# Patient Record
Sex: Female | Born: 1970
Health system: Southern US, Community
[De-identification: ages and names within clinical notes are randomized; demographics above are authoritative.]

## PROBLEM LIST (undated history)

## (undated) DIAGNOSIS — K8020 Calculus of gallbladder without cholecystitis without obstruction: Secondary | ICD-10-CM

## (undated) DIAGNOSIS — E079 Disorder of thyroid, unspecified: Secondary | ICD-10-CM

## (undated) HISTORY — PX: OTHER SURGICAL HISTORY: SHX169

## (undated) HISTORY — DX: Disorder of thyroid, unspecified: E07.9

## (undated) HISTORY — DX: Calculus of gallbladder without cholecystitis without obstruction: K80.20

---

## 2010-12-23 ENCOUNTER — Encounter
Admission: RE | Admit: 2010-12-23 | Discharge: 2010-12-23 | Payer: Self-pay | Source: Home / Self Care | Attending: Infectious Diseases | Admitting: Infectious Diseases

## 2011-05-24 ENCOUNTER — Inpatient Hospital Stay (HOSPITAL_COMMUNITY): Admit: 2011-05-24 | Payer: Medicaid Other | Admitting: Obstetrics

## 2011-05-28 ENCOUNTER — Encounter (HOSPITAL_COMMUNITY)
Admission: RE | Admit: 2011-05-28 | Discharge: 2011-05-28 | Disposition: A | Discharge status: Home / Self Care | Payer: Medicaid Other | Source: Ambulatory Visit | Attending: Obstetrics | Admitting: Obstetrics

## 2011-05-28 LAB — CBC
HCT: 42.9 % (ref 36.0–46.0)
Hemoglobin: 14.8 g/dL (ref 12.0–15.0)
MCH: 32.1 pg (ref 26.0–34.0)
MCHC: 34.5 g/dL (ref 30.0–36.0)
RBC: 4.61 MIL/uL (ref 3.87–5.11)

## 2011-05-28 LAB — SURGICAL PCR SCREEN
MRSA, PCR: NEGATIVE
Staphylococcus aureus: NEGATIVE

## 2011-05-28 NOTE — Patient Instructions (Addendum)
20 Donna Stephens  05/28/2011   Your procedure is scheduled on:  l  Report to [facility] at l AM.  Call this number if you have problems the morning of surgery: [phone]   Remember:   Do not eat food:After Midnight.  Do not drink clear liquids: After Midnight.  Take these medicines the morning of surgery with A SIP OF WATER: l   Do not wear jewelry, make-up or nail polish.  Do not bring valuables to the hospital.  Contacts, dentures or bridgework may not be worn into surgery.  Leave suitcase in the car. After surgery it may be brought to your room.  For patients admitted to the hospital, checkout time is 11:00 AM the day of discharge.   Patients discharged the day of surgery will not be allowed to drive home.  Name and phone number of your driver: l  Special Instructions: Incentive Spirometry - Practice and bring it with you on the day of surgery. and CHG Shower Use entire pack 2 days before surgery and repeat 1 day before surgery.   Please read over the following fact sheets that you were given:  hhhh Document Released: 11/23/2005 Document Re-Released: 03/11/2009 [facility] Patient Information 2011 [place], [place].

## 2011-05-29 ENCOUNTER — Ambulatory Visit (HOSPITAL_COMMUNITY)
Admission: RE | Admit: 2011-05-29 | Discharge: 2011-05-29 | Disposition: A | Discharge status: Home / Self Care | Payer: Medicaid Other | Source: Ambulatory Visit | Attending: Obstetrics | Admitting: Obstetrics

## 2011-05-29 ENCOUNTER — Other Ambulatory Visit: Payer: Self-pay | Admitting: Obstetrics

## 2011-05-29 ENCOUNTER — Inpatient Hospital Stay (HOSPITAL_COMMUNITY)
Admission: RE | Admit: 2011-05-29 | Discharge: 2011-06-01 | DRG: 766 | Disposition: A | Discharge status: Home / Self Care | Payer: Medicaid Other | Source: Ambulatory Visit | Attending: Obstetrics | Admitting: Obstetrics

## 2011-05-29 DIAGNOSIS — Z01812 Encounter for preprocedural laboratory examination: Secondary | ICD-10-CM

## 2011-05-29 DIAGNOSIS — O321XX0 Maternal care for breech presentation, not applicable or unspecified: Principal | ICD-10-CM | POA: Diagnosis present

## 2011-05-29 DIAGNOSIS — Z01818 Encounter for other preprocedural examination: Secondary | ICD-10-CM

## 2011-05-29 DIAGNOSIS — O09529 Supervision of elderly multigravida, unspecified trimester: Secondary | ICD-10-CM | POA: Diagnosis present

## 2011-05-29 LAB — ABO/RH: ABO/RH(D): O POS

## 2011-05-30 LAB — CBC
MCV: 93.2 fL (ref 78.0–100.0)
Platelets: 142 10*3/uL — ABNORMAL LOW (ref 150–400)
RBC: 3.81 MIL/uL — ABNORMAL LOW (ref 3.87–5.11)
RDW: 13.9 % (ref 11.5–15.5)
WBC: 13.2 10*3/uL — ABNORMAL HIGH (ref 4.0–10.5)

## 2011-06-01 NOTE — Op Note (Addendum)
NAMEBHUMI, GODBEY                  ACCOUNT NO.:  [id number]  MEDICAL RECORD NO.:  [id number]  LOCATION:  ULT                           [facility]ILITY:  [facility]  PHYSICIAN:  Shivali Quackenbush A. Clearance Coots, M.D.DATE OF BIRTH:  08-18-1971  DATE OF PROCEDURE:  05/29/2011 DATE OF DISCHARGE:                              OPERATIVE REPORT   PREOPERATIVE DIAGNOSIS:  40 plus weeks' gestation, breech presentation.  POSTOPERATIVE DIAGNOSIS:  40 plus weeks' gestation, breech presentation.  PROCEDURE:  Primary low transverse cesarean section.  SURGEON:  Tiphany Fayson A. Clearance Coots, MD  ASSISTANT:  Roseanna Rainbow, MD  ANESTHESIA:  Spinal.  ESTIMATED BLOOD LOSS:  700 mL.  COMPLICATIONS:  None.  Foley to gravity.  FINDINGS:  Viable female at 0805, Apgars of 8 at 1 minute and 9 at 5 minutes, weight of 7 pounds 15 and ounces, uterine fibroids, small.  SPECIMEN:  Placenta.  DISPOSITION:  Specimen to pathology.  OPERATION:  The patient was brought to the operating room and after satisfactory spinal anesthesia, the abdomen was prepped and draped in a usual sterile fashion.  An indwelling Foley catheter was placed in the urinary bladder.  Pfannenstiel skin incision was made with a scalpel that was deepened down to the fascia with a scalpel.  Fascia was nicked in the midline and the fascial incision was extended to the left and to the right with curved Mayo scissors.  The superior and inferior fascial edges were taken off the rectus muscle with both blunt and sharp dissection.  The rectus muscle was then sharply divided in the midline. Peritoneum was entered digitally and was digitally extended to the left and to the right.  The bladder blade was positioned in the incision. The vesicouterine fold of peritoneum above the reflection of the urinary bladder was grasped with forceps and was incised and undermined with Metzenbaum scissors.  The incision was extended to the left and to the right with Metzenbaum scissors.   The bladder flap was bluntly developed and the bladder blade was repositioned in front of the urinary bladder placing it well out of the operative field.  The uterus was then entered transversely in the lower uterine segment with a scalpel.  The uterine incision was extended to the left and to the right with the bandage scissors.  Clear amniotic fluid was expelled.  The delivery was then accomplished as a footling breech delivery in routine fashion without complications.  The infant's mouth and nose were suctioned with a suction bulb and the umbilical cord was doubly clamped and cut and the infant was handed off to the nursery staff.  Cord blood was obtained and the placenta was manually removed from the uterus intact.  The endometrial surface was thoroughly debrided with a dry lap sponge.  The edges of the uterine incision were grasped with ring forceps.  Uterus was closed with a continuous interlocking suture of 0 Monocryl from each corner to the center.  A second imbricating continuous layer of 0 Monocryl was then placed.  Hemostasis was excellent.  Pelvic cavity was thoroughly irrigated with warm saline solution and all clots were removed.  The abdomen was then closed as follows.  Inverted  T of Interceed antiadhesive agent was placed on the uterine incision and uterine fundus.  The parietal peritoneum was then closed with a continuous suture of 2-0 Vicryl.  The fascia was closed with a continuous suture of 0 Vicryl from each corner to the center. Subcutaneous tissue was thoroughly irrigated with warm saline solution and all areas of subcutaneous bleeding were coagulated with the Bovie. The skin was then closed with surgical stainless steel staples.  Sterile bandage was applied to the incision closure.  Surgical technician indicated that all needle, sponge, and instrument counts were correct x2.  The patient tolerated the procedure well, was transported to the recovery room in  satisfactory condition.     Taffie Eckmann A. Clearance Coots, M.D.     CAH/MEDQ  D:  05/29/2011  T:  05/29/2011  Job:  [id number]  Electronically Signed by Coral Ceo M.D. on 06/09/2011 09:17:19 AM

## 2011-06-05 ENCOUNTER — Inpatient Hospital Stay (HOSPITAL_COMMUNITY)
Admission: AD | Admit: 2011-06-05 | Discharge: 2011-06-05 | Disposition: A | Discharge status: Home / Self Care | Payer: Medicaid Other | Source: Ambulatory Visit | Attending: Obstetrics | Admitting: Obstetrics

## 2011-06-05 DIAGNOSIS — O909 Complication of the puerperium, unspecified: Secondary | ICD-10-CM | POA: Insufficient documentation

## 2011-06-05 LAB — CBC
HCT: 36.9 % (ref 36.0–46.0)
Hemoglobin: 12.3 g/dL (ref 12.0–15.0)
RBC: 3.94 MIL/uL (ref 3.87–5.11)

## 2011-06-09 NOTE — Discharge Summary (Signed)
  Donna Stephens, Donna Stephens                  ACCOUNT NO.:  [id number]  MEDICAL RECORD NO.:  [id number]  LOCATION:  9124                          [facility]ILITY:  [facility]  PHYSICIAN:  Grazia Taffe A. Clearance Coots, M.D.DATE OF BIRTH:  October 31, 1971  DATE OF ADMISSION:  05/29/2011 DATE OF DISCHARGE:  06/01/2011                              DISCHARGE SUMMARY   ADMITTING DIAGNOSES:  40 plus weeks' gestation, breech presentation, elective cesarean section.  DISCHARGE DIAGNOSIS:  40 plus weeks' gestation, breech presentation, elective cesarean section, status post primary low transverse cesarean section on May 29, 2011.  Viable female delivered at 0805, Apgars of 8 at 1 minute and 9 at 5 minutes, weight of 7 pounds 15 ounces.  Mother and infant discharged home in good condition.  REASON FOR ADMISSION:  This 40 year old G2 P1 estimated date of confinement of May 22, 2011, seen in the office for a biophysical profile at 40 weeks' gestation and ultrasound revealed the breech presentation.  PAST MEDICAL HISTORY:  Surgery, thyroidectomy.  ILLNESSES:  Hepatitis B.  FAMILY HISTORY:  Hypertension, diabetes and renal disease.  SOCIAL HISTORY:  Married.  Negative tobacco, alcohol or recreational drug use.  ALLERGIES:  No known drug allergies.  MEDICATIONS:  Prenatal vitamins.  REVIEW OF SYSTEMS:  Noncontributory.  PHYSICAL EXAMINATION:  GENERAL:  Well-nourished, well-developed female in no acute distress. VITAL SIGNS:  Afebrile.  Vital signs were stable. LUNGS:  Clear to auscultation bilaterally. HEART:  Regular rate and rhythm. ABDOMEN:  Gravid, nontender.  Cervix 2 cm dilated, long and footling breech presentation.  ADMITTING LABS:  Hemoglobin 14, hematocrit 42, white blood cell count 10,000, platelets 159,000.  MRSA culture was negative.  ABO, Rh O positive.  RPR was nonreactive.  HOSPITAL COURSE:  The patient  underwent primary low transverse cesarean section on May 29, 2011.  There are no intraoperative  complications. Postoperative course was uncomplicated.  The patient was discharged home on postop day #3 in good condition.  DISCHARGE LABS:  Hemoglobin of 11.8, hematocrit 35.5, white blood cell count 13,200, platelets 142,000.  DISCHARGE DISPOSITION:  Medications, continue prenatal vitamins. Percocet was prescribed for pain.  Ibuprofen was also prescribed for pain.  Routine written instructions were given for discharge after cesarean section.  The patient is to call office for follow up appointment in 2 weeks.     Gwendoline Judy A. Clearance Coots, M.D.     CAH/MEDQ  D:  06/01/2011  T:  06/01/2011  Job:  [id number]  Electronically Signed by Coral Ceo M.D. on 06/09/2011 09:17:16 AM

## 2012-04-07 ENCOUNTER — Ambulatory Visit: Payer: Self-pay | Admitting: Family Medicine

## 2012-04-07 VITALS — BP 105/63 | HR 69 | Temp 98.5°F | Resp 18 | Ht 64.0 in | Wt 130.0 lb

## 2012-04-07 DIAGNOSIS — L811 Chloasma: Secondary | ICD-10-CM

## 2012-04-07 DIAGNOSIS — L819 Disorder of pigmentation, unspecified: Secondary | ICD-10-CM

## 2012-04-07 MED ORDER — HYDROQUINONE 4 % EX CREA: TOPICAL_CREAM | Freq: Two times a day (BID) | CUTANEOUS | Status: AC

## 2012-04-07 NOTE — Progress Notes (Signed)
  Patient Name: Donna Stephens Date of Birth: 09/23/1971 Medical Record Number: [id number] Gender: female Date of Encounter: 04/07/2012  History of Present Illness:  Donna Stephens is a 41 y.o. very pleasant female patient who presents with the following:  Notes a dark rash on her face since she was pregnant about 13 years ago. She had another baby 10 months ago (no longer BF) and the rash got a little worse. Since she delivered the rash got a little bit better but has now hit a plateau.  It does not itch or hurt.  No other rashes or known medical problems   There is no problem list on file for this patient.  No past medical history on file. No past surgical history on file. History  Substance Use Topics  . Smoking status: Never Smoker   . Smokeless tobacco: Not on file  . Alcohol Use: Not on file   No family history on file. No Known Allergies  Medication list has been reviewed and updated.  Review of Systems: As per HPI- otherwise negative.  Physical Examination: Filed Vitals:   04/07/12 1723  BP: 105/63  Pulse: 69  Temp: 98.5 F (36.9 C)  TempSrc: Oral  Resp: 18  Height: 5\' 4"  (1.626 m)  Weight: 130 lb (58.968 kg)    Body mass index is 22.31 kg/(m^2).  GEN: WDWN, NAD, Non-toxic, A & O x 3 HEENT: Atraumatic, Normocephalic. Neck supple. No masses, No LAD. Oropharynx wnl, no oral lesions Ears and Nose: No external deformity. CV: RRR, No M/G/R. No JVD. No thrill. No extra heart sounds. PULM: CTA B, no wheezes, crackles, rhonchi. No retractions. No resp. distress. No accessory muscle use. EXTR: No c/c/e NEURO Normal gait.  PSYCH: Normally interactive. Conversant. Not depressed or anxious appearing.  Calm demeanor.  Melasma rash over cheeks bilaterally   Assessment and Plan: 1. Melasma  hydroquinone 4 % cream   Donna Stephens has had melasma for some time.  Explained that this is a chronic condition which will require maintenance and probably pulsed therapy.  Advised her to be  vigilant with sunscreen and avoid excess sun exposure.  Will try hydroquinone cream as above- BID for 2 or 3 months, and then daily as needed.  She will call if not getting better in a month or so-Sooner if worse.

## 2012-04-07 NOTE — Patient Instructions (Signed)
Melasma  Melasma is a skin condition. It does not spread from person to person (non-contagious). Melasma is an area of tan or brown coloring that usually appears on the cheeks, forehead, upper lip and neck. These patches can look like a mask. The discolored area do not itch and are not red or swollen. This usually occurs in women with skin that colors (pigments) easily and also women of light brown skin color. It often occurs with pregnancy, menopausal women on hormone replacement, with liver disease or taking oral contraceptives especially when followed by sun exposure. It can also occur in men and nonpregnant women. It is more common in tropical climates.  CAUSES  Increase in pigment producing cells (melanocytes) in the skin.   Becoming pregnant.   Women on oral contraceptives.   Menopausal women on hormone replacement treatment.   Increase exposure to the sun in women with light brown skin.   It can be hereditary.   Allergies to medications or cosmetics.   Thyroid disease.   Addison's disease (loss of function of the adrenal gland).   Excessive stress.  SYMPTOMS  There are no symptoms except for the discolored skin with dark or tan blotches. DIAGNOSIS   Melasma is diagnosed based on its physical appearance.   Wood's lamp to examine the discolored skin.  PREVENTION  To lower the risk of melasma, a woman can avoid oral contraceptives, hormone replacement treatment in menopause and stay out of the sun.  PROGNOSIS  There are no long-term effects from melasma. Using strong sun block may help.  TREATMENT   Bleaching creams, skin care products, peels that contain glycolic acid, skin peels and sunscreens that extend into the UVA blocking range are all helpful in the treatment of melasma.   The darkened skin of melasma usually fades somewhat after a woman gives birth or stops using oral contraceptives.   Specific products used to treat the melasma may have side effects. Some people  may have a mild allergic reaction to the cream or bleach.   Using a combination of hydroquinone and glycolic acid, prescription or over-the-counter medicines. Follow the advice of your caregiver.   Tretinoin. This should not be used when pregnant.   Azelaic acid 20%.   Facial peels with alpha hydroxy acids or chemical peels.   Laser treatment.   Cosmetic cover-ups are available.  Avoid sunlight during all of these treatments. HOME CARE INSTRUCTIONS   Problems which are getting worse should be reported to your caregiver.   Avoid overexposure to the sun, especially in tropical areas.   Use a strong sun block cream when you are in the sun.  SEEK MEDICAL CARE IF:   You develop skin blotches and want to get them checked out.   You want to know what kind of treatment you can use for the skin blotches.   If you have skin blotches and they are getting worse with or without treatment.   You notice bleeding from the skin blotches.  Document Released: 02/13/2003 Document Revised: 11/12/2011 Document Reviewed: 09/19/2008 [facility] Patient Information 2012 [place], [place].

## 2013-09-05 ENCOUNTER — Ambulatory Visit (INDEPENDENT_AMBULATORY_CARE_PROVIDER_SITE_OTHER): Payer: BC Managed Care – PPO | Admitting: Family Medicine

## 2013-09-05 VITALS — BP 102/60 | HR 73 | Temp 97.9°F | Resp 18 | Ht 63.5 in | Wt 123.0 lb

## 2013-09-05 DIAGNOSIS — L0291 Cutaneous abscess, unspecified: Secondary | ICD-10-CM

## 2013-09-05 MED ORDER — DOXYCYCLINE HYCLATE 100 MG PO TABS: 100.0000 mg | ORAL_TABLET | Freq: Two times a day (BID) | ORAL | Status: DC

## 2013-09-05 NOTE — Progress Notes (Signed)
I directly supervised and participated in the procedure and agree with the student's documentation.  

## 2013-09-05 NOTE — Progress Notes (Signed)
Verbal consent obtained. Local anesthesia 2 cc of 2% Lidocaine plain, incision with 15 blade. Purulent material expressed. Culture obtained. Wound explored. No foreign bodies. Wound is shallow and does not require packing. Area cleansed and dressed.

## 2013-09-05 NOTE — Progress Notes (Signed)
This 42 year old woman who comes in with 2 weeks of progressive left groin focal swelling and discoloration in the pubic area. She's had no significant pain or fever. There's been no discharge from the area either.  Patient's not had this before and she complains of no other skin rashes.  Objective: Violaceous dark and three-quarter centimeter elevated fluctuant cutaneous lesion in the left pubic region. It is nontender. There is surrounding erythema.  Assessment: Blocked pore with mild cellulitis.  Plan:Cellulitis and abscess - Plan: doxycycline (VIBRA-TABS) 100 MG tablet, Wound culture  Signed, Elvina Sidle, MD

## 2013-09-08 LAB — WOUND CULTURE
Gram Stain: NONE SEEN
Gram Stain: NONE SEEN
Gram Stain: NONE SEEN

## 2013-11-12 ENCOUNTER — Ambulatory Visit (INDEPENDENT_AMBULATORY_CARE_PROVIDER_SITE_OTHER): Payer: BC Managed Care – PPO | Admitting: Physician Assistant

## 2013-11-12 VITALS — BP 100/62 | HR 58 | Temp 97.9°F | Resp 16 | Ht 63.5 in | Wt 124.0 lb

## 2013-11-12 DIAGNOSIS — R8271 Bacteriuria: Secondary | ICD-10-CM

## 2013-11-12 DIAGNOSIS — R3129 Other microscopic hematuria: Secondary | ICD-10-CM

## 2013-11-12 DIAGNOSIS — N898 Other specified noninflammatory disorders of vagina: Secondary | ICD-10-CM

## 2013-11-12 DIAGNOSIS — N760 Acute vaginitis: Secondary | ICD-10-CM

## 2013-11-12 DIAGNOSIS — R82998 Other abnormal findings in urine: Secondary | ICD-10-CM

## 2013-11-12 DIAGNOSIS — Z01419 Encounter for gynecological examination (general) (routine) without abnormal findings: Secondary | ICD-10-CM

## 2013-11-12 DIAGNOSIS — Z0000 Encounter for general adult medical examination without abnormal findings: Secondary | ICD-10-CM

## 2013-11-12 DIAGNOSIS — Z23 Encounter for immunization: Secondary | ICD-10-CM

## 2013-11-12 DIAGNOSIS — Z124 Encounter for screening for malignant neoplasm of cervix: Secondary | ICD-10-CM

## 2013-11-12 DIAGNOSIS — B9689 Other specified bacterial agents as the cause of diseases classified elsewhere: Secondary | ICD-10-CM

## 2013-11-12 DIAGNOSIS — Z1211 Encounter for screening for malignant neoplasm of colon: Secondary | ICD-10-CM

## 2013-11-12 DIAGNOSIS — K8020 Calculus of gallbladder without cholecystitis without obstruction: Secondary | ICD-10-CM

## 2013-11-12 LAB — POCT CBC
HCT, POC: 48.4 % — AB (ref 37.7–47.9)
Hemoglobin: 15 g/dL (ref 12.2–16.2)
Lymph, poc: 1.8 (ref 0.6–3.4)
MCH, POC: 29.6 pg (ref 27–31.2)
MCHC: 31 g/dL — AB (ref 31.8–35.4)
MCV: 95.6 fL (ref 80–97)
MPV: 9.4 fL (ref 0–99.8)
RBC: 5.06 M/uL (ref 4.04–5.48)
WBC: 6.9 10*3/uL (ref 4.6–10.2)

## 2013-11-12 LAB — POCT UA - MICROSCOPIC ONLY
Crystals, Ur, HPF, POC: NEGATIVE
Mucus, UA: POSITIVE
Yeast, UA: NEGATIVE

## 2013-11-12 LAB — COMPREHENSIVE METABOLIC PANEL
ALT: 21 U/L (ref 0–35)
BUN: 10 mg/dL (ref 6–23)
CO2: 29 mEq/L (ref 19–32)
Calcium: 9.1 mg/dL (ref 8.4–10.5)
Chloride: 103 mEq/L (ref 96–112)
Creat: 0.64 mg/dL (ref 0.50–1.10)
Glucose, Bld: 89 mg/dL (ref 70–99)

## 2013-11-12 LAB — POCT URINALYSIS DIPSTICK
Bilirubin, UA: NEGATIVE
Ketones, UA: NEGATIVE
Nitrite, UA: NEGATIVE
pH, UA: 6

## 2013-11-12 LAB — LIPID PANEL
Cholesterol: 166 mg/dL (ref 0–200)
Total CHOL/HDL Ratio: 3.7 Ratio
Triglycerides: 104 mg/dL (ref <150)

## 2013-11-12 LAB — POCT WET PREP WITH KOH
KOH Prep POC: NEGATIVE
Trichomonas, UA: NEGATIVE

## 2013-11-12 LAB — IFOBT (OCCULT BLOOD): IFOBT: NEGATIVE

## 2013-11-12 MED ORDER — METRONIDAZOLE 500 MG PO TABS: 500.0000 mg | ORAL_TABLET | Freq: Two times a day (BID) | ORAL | Status: DC

## 2013-11-12 NOTE — Patient Instructions (Addendum)
I will contact you with your lab results as soon as they are available.   If you have not heard from me in 2 weeks, please contact me.  The fastest way to get your results is to register for My Chart (see the instructions on the last page of this printout).  If you have not heard anything regarding the gallbladder ultrasound in 10 days, please contact our office.  Keeping You Healthy  Get These Tests 1. Blood Pressure- Have your blood pressure checked once a year by your health care provider.  Normal blood pressure is 120/80. 2. Weight- Have your body mass index (BMI) calculated to screen for obesity.  BMI is measure of body fat based on height and weight.  You can also calculate your own BMI at https://www.west-esparza.com/. 3. Cholesterol- Have your cholesterol checked every 5 years starting at age 11 then yearly starting at age 84. 4. Chlamydia, HIV, and other sexually transmitted diseases- Get screened every year until age 36, then within three months of each new sexual provider. 5. Pap Smear- Every 1-3 years; discuss with your health care provider. 6. Mammogram- Every year starting at age 62  Take these medicines  Calcium with Vitamin D-Your body needs 1200 mg of Calcium each day and [phone] IU of Vitamin D daily.  Your body can only absorb 500 mg of Calcium at a time so Calcium must be taken in 2 or 3 divided doses throughout the day.  Multivitamin with folic acid- Once daily if it is possible for you to become pregnant.  Get these Immunizations  Gardasil-Series of three doses; prevents HPV related illness such as genital warts and cervical cancer.  Menactra-Single dose; prevents meningitis.  Tetanus shot- Every 10 years.  Flu shot-Every year.  Take these steps 1. Do not smoke-Your healthcare provider can help you quit.  For tips on how to quit go to www.smokefree.gov or call 1-800 QUITNOW. 2. Be physically active- Exercise 5 days a week for at least 30 minutes.  If you are not  already physically active, start slow and gradually work up to 30 minutes of moderate physical activity.  Examples of moderate activity include walking briskly, dancing, swimming, bicycling, etc. 3. Breast Cancer- A self breast exam every month is important for early detection of breast cancer.  For more information and instruction on self breast exams, ask your healthcare provider or [place]. 4. Eat a healthy diet- Eat a variety of healthy foods such as fruits, vegetables, whole grains, low fat milk, low fat cheeses, yogurt, lean meats, poultry and fish, beans, nuts, tofu, etc.  For more information go to www. Thenutritionsource.org 5. Drink alcohol in moderation- Limit alcohol intake to one drink or less per day. Never drink and drive. 6. Depression- Your emotional health is as important as your physical health.  If you're feeling down or losing interest in things you normally enjoy please talk to your healthcare provider about being screened for depression. 7. Dental visit- Brush and floss your teeth twice daily; visit your dentist twice a year. 8. Eye doctor- Get an eye exam at least every 2 years. 9. Helmet use- Always wear a helmet when riding a bicycle, motorcycle, rollerblading or skateboarding. 10. Safe sex- If you may be exposed to sexually transmitted infections, use a condom. 11. Seat belts- Seat belts can save your live; always wear one. 12. Smoke/Carbon Monoxide detectors- These detectors need to be installed on the appropriate level of your home. Replace batteries at least once a year.  13. Skin cancer- When out in the sun please cover up and use sunscreen 15 SPF or higher. 14. Violence- If anyone is threatening or hurting you, please tell your healthcare provider.

## 2013-11-12 NOTE — Progress Notes (Signed)
Subjective:    Patient ID: Donna Stephens, female    DOB: [birth year], 42 y.o.   MRN: [id number]  PCP: No PCP Per Patient  The patient does not speak English. She brought a Nurse, learning disability for the visit.  Chief Complaint  Patient presents with  . Annual Exam    W/ PAP - Pt is fasting     Active Ambulatory Problems    Diagnosis Date Noted  . Gallstones    Resolved Ambulatory Problems    Diagnosis Date Noted  . No Resolved Ambulatory Problems   Past Medical History  Diagnosis Date  . Thyroid disease     Past Surgical History  Procedure Laterality Date  . Cesarean section    . Hemi thyroidectomy Right     benign    No Known Allergies  Prior to Admission medications   Medication Sig Start Date End Date Taking? Authorizing Provider  UNKNOWN TO PATIENT Take 1 tablet by mouth daily. Oral contraceptive pill from [place]   Yes Historical Provider, MD    History   Social History  . Marital Status: Married    Spouse Name: N/A    Number of Children: 2  . Years of Education: 9th grade   Occupational History  . office      takes phone orders   Social History Main Topics  . Smoking status: Never Smoker   . Smokeless tobacco: Never Used  . Alcohol Use: No  . Drug Use: No  . Sexual Activity: Yes    Partners: Male    Birth Control/ Protection: Pill   Other Topics Concern  . None   Social History Narrative   From [place], came to the [place] in 2009.  One son was born there, one in the [place].  She lives with her husband and their 2 sons.    family history is not on file. indicated that her mother is alive. She indicated that her father is alive. She indicated that all of her four sisters are alive. She indicated that both of her sons are alive.    HPI  Presents for annual exam.  Last breast exam/pap was 06/2012 in [place].  Has never had a mammogram.  No family history of breast cancer.  She would like an updated GB US to reassess the known gallstone she has, which causes episodic  RUQ abdominal pain that keeps her from sleeping.  She'd like to avoid surgical intervention unless it is recommended otherwise.   Review of Systems  Constitutional: Negative.   HENT: Negative.   Eyes: Negative.   Respiratory: Negative.   Cardiovascular: Negative.   Gastrointestinal: Negative for nausea, vomiting, diarrhea, constipation, blood in stool, abdominal distention, anal bleeding and rectal pain.       Episodic RUQ abdominal pain  Genitourinary: Negative.   Musculoskeletal: Negative.   Skin: Negative.   Neurological: Negative.   Psychiatric/Behavioral: Negative.        Objective:   Physical Exam  Vitals reviewed. Constitutional: She is oriented to person, place, and time. Vital signs are normal. She appears well-developed and well-nourished. She is active and cooperative. No distress.  HENT:  Head: Normocephalic and atraumatic.  Right Ear: Hearing, tympanic membrane, external ear and ear canal normal. No foreign bodies.  Left Ear: Hearing, tympanic membrane, external ear and ear canal normal. No foreign bodies.  Nose: Nose normal.  Mouth/Throat: Uvula is midline, oropharynx is clear and moist and mucous membranes are normal. No oral lesions. Normal dentition. No dental abscesses  or uvula swelling. No oropharyngeal exudate.  Eyes: Conjunctivae, EOM and lids are normal. Pupils are equal, round, and reactive to light. Right eye exhibits no discharge. Left eye exhibits no discharge. No scleral icterus.  Fundoscopic exam:      The right eye shows no arteriolar narrowing, no AV nicking, no exudate, no hemorrhage and no papilledema.       The left eye shows no arteriolar narrowing, no AV nicking, no exudate, no hemorrhage and no papilledema.  Neck: Trachea normal, normal range of motion and full passive range of motion without pain. Neck supple. No spinous process tenderness and no muscular tenderness present. No mass and no thyromegaly present.  Cardiovascular: Normal rate,  regular rhythm, normal heart sounds, intact distal pulses and normal pulses.   Pulmonary/Chest: Effort normal and breath sounds normal. She exhibits no tenderness and no retraction. Right breast exhibits no inverted nipple, no mass, no nipple discharge, no skin change and no tenderness. Left breast exhibits no inverted nipple, no mass, no nipple discharge, no skin change and no tenderness. Breasts are symmetrical.  Abdominal: Soft. Normal appearance and bowel sounds are normal. She exhibits no distension and no mass. There is no hepatosplenomegaly. There is no tenderness. There is no rigidity, no rebound, no guarding, no CVA tenderness, no tenderness at McBurney's point and negative Murphy's sign. No hernia. Hernia confirmed negative in the right inguinal area and confirmed negative in the left inguinal area.  Genitourinary: Rectum normal and uterus normal. Rectal exam shows no external hemorrhoid and no fissure. No breast swelling, tenderness, discharge or bleeding. Pelvic exam was performed with patient supine. No labial fusion. There is no rash, tenderness, lesion or injury on the right labia. There is no rash, tenderness, lesion or injury on the left labia. Cervix exhibits no motion tenderness, no discharge and no friability. Right adnexum displays no mass, no tenderness and no fullness. Left adnexum displays no mass, no tenderness and no fullness. No erythema, tenderness or bleeding around the vagina. No foreign body around the vagina. No signs of injury around the vagina. Vaginal discharge (thick, white without malodor) found.  Musculoskeletal: She exhibits no edema and no tenderness.       Cervical back: Normal.       Thoracic back: Normal.       Lumbar back: Normal.  Lymphadenopathy:       Head (right side): No tonsillar, no preauricular, no posterior auricular and no occipital adenopathy present.       Head (left side): No tonsillar, no preauricular, no posterior auricular and no occipital  adenopathy present.    She has no cervical adenopathy.    She has no axillary adenopathy.       Right: No inguinal and no supraclavicular adenopathy present.       Left: No inguinal and no supraclavicular adenopathy present.  Neurological: She is alert and oriented to person, place, and time. She has normal strength and normal reflexes. No cranial nerve deficit. She exhibits normal muscle tone. Coordination and gait normal.  Skin: Skin is warm, dry and intact. No rash noted. She is not diaphoretic. No cyanosis or erythema. Nails show no clubbing.  Psychiatric: She has a normal mood and affect. Her speech is normal and behavior is normal. Judgment and thought content normal.      Results for orders placed in visit on 11/12/13  POCT CBC      Result Value Range   WBC 6.9  4.6 - 10.2 K/uL  Lymph, poc 1.8  0.6 - 3.4   POC LYMPH PERCENT 25.8  10 - 50 %L   MID (cbc) 0.3  0 - 0.9   POC MID % 4.5  0 - 12 %M   POC Granulocyte 4.8  2 - 6.9   Granulocyte percent 69.7  37 - 80 %G   RBC 5.06  4.04 - 5.48 M/uL   Hemoglobin 15.0  12.2 - 16.2 g/dL   HCT, POC 09.8 (*) 11.9 - 47.9 %   MCV 95.6  80 - 97 fL   MCH, POC 29.6  27 - 31.2 pg   MCHC 31.0 (*) 31.8 - 35.4 g/dL   RDW, POC 14.7     Platelet Count, POC 225  142 - 424 K/uL   MPV 9.4  0 - 99.8 fL  POCT UA - MICROSCOPIC ONLY      Result Value Range   WBC, Ur, HPF, POC 1-2     RBC, urine, microscopic 3-5     Bacteria, U Microscopic 2+     Mucus, UA pos     Epithelial cells, urine per micros 1-2     Crystals, Ur, HPF, POC neg     Casts, Ur, LPF, POC neg     Yeast, UA neg    POCT URINALYSIS DIPSTICK      Result Value Range   Color, UA yellow     Clarity, UA clear     Glucose, UA neg     Bilirubin, UA neg     Ketones, UA neg     Spec Grav, UA 1.020     Blood, UA trace     pH, UA 6.0     Protein, UA neg     Urobilinogen, UA 0.2     Nitrite, UA neg     Leukocytes, UA Negative    IFOBT (OCCULT BLOOD)      Result Value Range   IFOBT  Negative    POCT WET PREP WITH KOH      Result Value Range   Trichomonas, UA Negative     Clue Cells Wet Prep HPF POC 10-15     Epithelial Wet Prep HPF POC 2-6     Yeast Wet Prep HPF POC neg     Bacteria Wet Prep HPF POC 2+     RBC Wet Prep HPF POC 1-3     WBC Wet Prep HPF POC 4-5     KOH Prep POC Negative         Assessment & Plan:  1. Routine general medical examination at a health care facility Age appropriate anticipatory guidance provided. - POCT CBC - Lipid panel - TSH - POCT UA - Microscopic Only - POCT urinalysis dipstick  2. Encounter for routine gynecological examination  3. Need for influenza vaccination - Flu Vaccine QUAD 36+ mos IM  4. Screening for cervical cancer If both pap and HPV are negative, repeat both in 5 years. - Pap IG and HPV (high risk) DNA detection  5. Screening for colon cancer - IFOBT POC (occult bld, rslt in office)  6. Gallstones Intermittent symptoms.  Would like to avoid surgery unless it's necessary. - Comprehensive metabolic panel - US Abdomen Limited RUQ; Future  7. Vaginal discharge asymptomatic - POCT Wet Prep with KOH  8. BV (bacterial vaginosis) asymptomatic - metroNIDAZOLE (FLAGYL) 500 MG tablet; Take 1 tablet (500 mg total) by mouth 2 (two) times daily with a meal. DO NOT CONSUME ALCOHOL WHILE TAKING THIS  MEDICATION.  Dispense: 14 tablet; Refill: 0  9. Bacteria in urine Await Cx - Urine culture  10. Microscopic hematuria If Ucx is negative, repeat to verify clearance, or refer to urology   Fernande Bras, PA-C Physician Assistant-Certified [facility]

## 2013-11-13 LAB — PAP IG AND HPV HIGH-RISK

## 2013-11-14 LAB — URINE CULTURE
Colony Count: NO GROWTH
Organism ID, Bacteria: NO GROWTH

## 2013-11-16 ENCOUNTER — Encounter: Payer: Self-pay | Admitting: Physician Assistant

## 2013-11-16 ENCOUNTER — Ambulatory Visit
Admission: RE | Admit: 2013-11-16 | Discharge: 2013-11-16 | Disposition: A | Discharge status: Home / Self Care | Payer: BC Managed Care – PPO | Source: Ambulatory Visit | Attending: Physician Assistant | Admitting: Physician Assistant

## 2013-11-16 DIAGNOSIS — R160 Hepatomegaly, not elsewhere classified: Secondary | ICD-10-CM | POA: Insufficient documentation

## 2013-11-16 DIAGNOSIS — K8020 Calculus of gallbladder without cholecystitis without obstruction: Secondary | ICD-10-CM

## 2015-08-03 ENCOUNTER — Ambulatory Visit (INDEPENDENT_AMBULATORY_CARE_PROVIDER_SITE_OTHER): Payer: Self-pay | Admitting: Emergency Medicine

## 2015-08-03 VITALS — BP 114/62 | HR 76 | Temp 97.8°F | Resp 18 | Ht 63.0 in | Wt 123.0 lb

## 2015-08-03 DIAGNOSIS — R21 Rash and other nonspecific skin eruption: Secondary | ICD-10-CM

## 2015-08-03 DIAGNOSIS — L259 Unspecified contact dermatitis, unspecified cause: Secondary | ICD-10-CM

## 2015-08-03 DIAGNOSIS — L52 Erythema nodosum: Secondary | ICD-10-CM

## 2015-08-03 LAB — POCT CBC
GRANULOCYTE PERCENT: 57.1 % (ref 37–80)
HCT, POC: 43.7 % (ref 37.7–47.9)
HEMOGLOBIN: 14.2 g/dL (ref 12.2–16.2)
Lymph, poc: 2.2 (ref 0.6–3.4)
MCH: 28.5 pg (ref 27–31.2)
MCHC: 32.5 g/dL (ref 31.8–35.4)
MCV: 87.9 fL (ref 80–97)
MID (CBC): 0.7 (ref 0–0.9)
MPV: 6.9 fL (ref 0–99.8)
PLATELET COUNT, POC: 336 10*3/uL (ref 142–424)
POC Granulocyte: 3.8 (ref 2–6.9)
POC LYMPH PERCENT: 32.8 %L (ref 10–50)
POC MID %: 10.1 % (ref 0–12)
RBC: 4.97 M/uL (ref 4.04–5.48)
RDW, POC: 14.2 %
WBC: 6.7 10*3/uL (ref 4.6–10.2)

## 2015-08-03 MED ORDER — CEPHALEXIN 500 MG PO CAPS: 500.0000 mg | ORAL_CAPSULE | Freq: Three times a day (TID) | ORAL | Status: DC

## 2015-08-03 MED ORDER — PREDNISONE 20 MG PO TABS: 40.0000 mg | ORAL_TABLET | Freq: Every day | ORAL | Status: DC

## 2015-08-03 NOTE — Patient Instructions (Addendum)
Your rash is likely from a contact dermatitis from being out in the yard. Please take the prednisone 2 tabs per day for 5 days.  Please take the keflex (antibiotic) 3 times per day for 5 days.  Please come back to see [place] if she's not improved in 4-5 days, if she starts having fevers or chills, or if the rash worsens.  Taking an antihistamine like benadryl at night can help with the itching.  Your white blood cell count was normal which is reassuring. If the lump in your leg is not improving in 2 weeks please come back to see [place] for further workup.

## 2015-08-03 NOTE — Progress Notes (Signed)
   Subjective:    Patient ID: Donna Stephens, female    DOB: 1971-10-23, 44 y.o.   MRN: [id number]  Chief Complaint  Patient presents with  . Rash    all over, itch    Medications, allergies, past medical history, surgical history, family history, social history and problem list reviewed and updated.  HPI  80 yof presents with son interpreting.   Itchy rash bilateral arms and legs started 5 days ago. Had been working out in yard earlier that day. Lives at home with several other people including both her sons. They each have several similar lesions.   Her rash is itchy. Denies fevers, chills. Denies bed bugs.   Review of Systems See HPI     Objective:   Physical Exam  Constitutional: She is oriented to person, place, and time. She appears well-developed and well-nourished.  Non-toxic appearance. She does not have a sickly appearance. She does not appear ill. No distress.  BP 114/62 mmHg  Pulse 76  Temp(Src) 97.8 F (36.6 C) (Oral)  Resp 18  Ht  (1.6 m)  Wt 123 lb (55.792 kg)  BMI 21.79 kg/m2  SpO2 99%  LMP 07/23/2015   Neurological: She is alert and oriented to person, place, and time.  Skin:  Multiple papules and both popped and intact fluid filled vesicles bilateral arms and legs. Lesions start mid foot bilterally and extend to mid shin. Lesions start at wrists bilaterally and extend to mid forearm. Small linear lesion right wrist and left ankle.   Several small papules right mid shin with mild surrounding erythema.   Large approx 2" x 4" slightly red circular firm slightly raised nodular lesion right anterior shin.   Psychiatric: She has a normal mood and affect. Her speech is normal and behavior is normal.   Results for orders placed or performed in visit on 08/03/15  POCT CBC  Result Value Ref Range   WBC 6.7 4.6 - 10.2 K/uL   Lymph, poc 2.2 0.6 - 3.4   POC LYMPH PERCENT 32.8 10 - 50 %L   MID (cbc) 0.7 0 - 0.9   POC MID % 10.1 0 - 12 %M   POC Granulocyte 3.8  2 - 6.9   Granulocyte percent 57.1 37 - 80 %G   RBC 4.97 4.04 - 5.48 M/uL   Hemoglobin 14.2 12.2 - 16.2 g/dL   HCT, POC 40.9 81.1 - 47.9 %   MCV 87.9 80 - 97 fL   MCH, POC 28.5 27 - 31.2 pg   MCHC 32.5 31.8 - 35.4 g/dL   RDW, POC 91.4 %   Platelet Count, POC 336 142 - 424 K/uL   MPV 6.9 0 - 99.8 fL      Assessment & Plan:   Contact dermatitis - Plan: predniSONE (DELTASONE) 20 MG tablet  Rash and nonspecific skin eruption - Plan: cephALEXin (KEFLEX) 500 MG capsule  Erythema nodosum - Plan: POCT CBC --suspect contact dermatitis likely poison ivy as cause of papules/vesicles with slightly linear distribution --oral pred burst, keflex for possible secondary cellulitis, benadryl for itching --EN likely secondary to result inflammation/infection assoc with contact dermatitis, also could be secondary to ocp --cbc normal --suspect will resolve with oral pred burst --pt rtc if EN persisting in 2 wks for further work up  Donna Lopes, PA-C Physician Assistant-Certified [facility]  08/03/2015 3:31 PM

## 2015-08-10 ENCOUNTER — Emergency Department (HOSPITAL_BASED_OUTPATIENT_CLINIC_OR_DEPARTMENT_OTHER)
Admission: EM | Admit: 2015-08-10 | Discharge: 2015-08-10 | Disposition: A | Discharge status: Home / Self Care | Payer: Self-pay | Attending: Emergency Medicine | Admitting: Emergency Medicine

## 2015-08-10 ENCOUNTER — Encounter (HOSPITAL_BASED_OUTPATIENT_CLINIC_OR_DEPARTMENT_OTHER): Payer: Self-pay | Admitting: *Deleted

## 2015-08-10 DIAGNOSIS — L259 Unspecified contact dermatitis, unspecified cause: Secondary | ICD-10-CM | POA: Insufficient documentation

## 2015-08-10 MED ORDER — TRIAMCINOLONE ACETONIDE 0.1 % EX CREA: 1.0000 "application " | TOPICAL_CREAM | Freq: Two times a day (BID) | CUTANEOUS | Status: DC

## 2015-08-10 MED ORDER — HYDROXYZINE HCL 25 MG PO TABS
25.0000 mg | ORAL_TABLET | Freq: Once | ORAL | Status: AC
  Administered 2015-08-10: 25 mg via ORAL
  Filled 2015-08-10: qty 1

## 2015-08-10 MED ORDER — HYDROXYZINE HCL 25 MG PO TABS: 25.0000 mg | ORAL_TABLET | Freq: Four times a day (QID) | ORAL | Status: DC

## 2015-08-10 MED ORDER — DEXAMETHASONE SODIUM PHOSPHATE 10 MG/ML IJ SOLN
10.0000 mg | Freq: Once | INTRAMUSCULAR | Status: AC
  Administered 2015-08-10: 10 mg via INTRAMUSCULAR
  Filled 2015-08-10: qty 1

## 2015-08-10 NOTE — ED Notes (Signed)
Pt c/o poison ivy x 3 weeks , seen by PMD for same, completed meds no relief

## 2015-08-10 NOTE — ED Provider Notes (Signed)
CSN: [id number]     Arrival date & time 08/10/15  1735 History   First MD Initiated Contact with Patient 08/10/15 1736     Chief Complaint  Patient presents with  . Poison Ivy     (Consider location/radiation/quality/duration/timing/severity/associated sxs/prior Treatment) HPI Comments: 44 year old female presenting with an itching rash 3 weeks. She was diagnosed with poison ivy 3 weeks ago, completed a five-day course of 40 mg prednisone along with Flagyl and Keflex with concerns for infection. 3 weeks ago, the patient and family were sitting outside in the backyard and the patient, her husband and son all poison ivy. Her husband and son are getting better, however hers is still itching and gradually spreading. No new soaps, detergents or lotions. No fevers. Denies difficulty breathing or swallowing. No known allergies. Has tried tea tree oil which is making it worse.  Patient is a 44 y.o. female presenting with poison ivy. The history is provided by the patient and a relative. The history is limited by a language barrier. A language interpreter was used.  Poison Donna Stephens This is a new problem. The current episode started 1 to 4 weeks ago. The problem occurs constantly. The problem has been gradually worsening. Associated symptoms include a rash. Nothing aggravates the symptoms. Treatments tried: steroids, calamine, benadryl. The treatment provided no relief.    History reviewed. No pertinent past medical history. History reviewed. No pertinent past surgical history. History reviewed. No pertinent family history. Social History  Substance Use Topics  . Smoking status: Never Smoker   . Smokeless tobacco: None  . Alcohol Use: No   OB History    No data available     Review of Systems  Skin: Positive for rash.  All other systems reviewed and are negative.     Allergies  Review of patient's allergies indicates no known allergies.  Home Medications   Prior to Admission medications    Medication Sig Start Date End Date Taking? Authorizing Provider  hydrOXYzine (ATARAX/VISTARIL) 25 MG tablet Take 1 tablet (25 mg total) by mouth every 6 (six) hours. 08/10/15   Kathrynn Speed, PA-C  triamcinolone cream (KENALOG) 0.1 % Apply 1 application topically 2 (two) times daily. 08/10/15   Lochlan Grygiel M Patrice Moates, PA-C   BP 118/73 mmHg  Pulse 74  Temp(Src) 97.9 F (36.6 C) (Oral)  Resp 18  SpO2 100%  LMP 07/23/2015 Physical Exam  Constitutional: She is oriented to person, place, and time. She appears well-developed and well-nourished. No distress.  HENT:  Head: Normocephalic and atraumatic.  Mouth/Throat: Oropharynx is clear and moist.  Eyes: Conjunctivae and EOM are normal.  Neck: Normal range of motion. Neck supple.  Cardiovascular: Normal rate, regular rhythm and normal heart sounds.   Pulmonary/Chest: Effort normal and breath sounds normal. No respiratory distress.  Musculoskeletal: Normal range of motion. She exhibits no edema.  Neurological: She is alert and oriented to person, place, and time. No sensory deficit.  Skin: Skin is warm and dry.  Scattered maculopapular rash on BL lower legs, arms/forearms, back, chest. Areas on lower legs and forearms excoriated and beginning to form tiny blisters. No surrounding cellulitis and no secondary infection.  Psychiatric: She has a normal mood and affect. Her behavior is normal.  Nursing note and vitals reviewed.   ED Course  Procedures (including critical care time) Labs Review Labs Reviewed - No data to display  Imaging Review No results found. I have personally reviewed and evaluated these images and lab results as part of my  medical decision-making.   EKG Interpretation None      MDM   Final diagnoses:  Contact dermatitis   NAD. AF VSS. No respiratory or airway compromise. No secondary infection. Has already been on 2 antibiotics as urgent care was concerned for superimposed infection. Will give IM Decadron along with topical  triamcinolone cream and Atarax for itching. Advised against use of tea tree oil. This is most likely poison ivy given the entire family was effective initially. Stable for discharge. Follow-up with PCP. Return precautions given. Patient and family state understanding of plan and are agreeable.  Kathrynn Speed, PA-C 08/10/15 1808  Eber Hong, MD 08/10/15 [phone]

## 2015-08-10 NOTE — Discharge Instructions (Signed)
Apply triamcinolone cream as directed. Do not use tea tree oil. Take Atarax as prescribed for itching. Follow-up with your primary care physician.  Poison [employer] ivy is a inflammation of the skin (contact dermatitis) caused by touching the allergens on the leaves of the ivy plant following previous exposure to the plant. The rash usually appears 48 hours after exposure. The rash is usually bumps (papules) or blisters (vesicles) in a linear pattern. Depending on your own sensitivity, the rash may simply cause redness and itching, or it may also progress to blisters which may break open. These must be well cared for to prevent secondary bacterial (germ) infection, followed by scarring. Keep any open areas dry, clean, dressed, and covered with an antibacterial ointment if needed. The eyes may also get puffy. The puffiness is worst in the morning and gets better as the day progresses. This dermatitis usually heals without scarring, within 2 to 3 weeks without treatment. HOME CARE INSTRUCTIONS  Thoroughly wash with soap and water as soon as you have been exposed to poison ivy. You have about one half hour to remove the plant resin before it will cause the rash. This washing will destroy the oil or antigen on the skin that is causing, or will cause, the rash. Be sure to wash under your fingernails as any plant resin there will continue to spread the rash. Do not rub skin vigorously when washing affected area. Poison ivy cannot spread if no oil from the plant remains on your body. A rash that has progressed to weeping sores will not spread the rash unless you have not washed thoroughly. It is also important to wash any clothes you have been wearing as these may carry active allergens. The rash will return if you wear the unwashed clothing, even several days later. Avoidance of the plant in the future is the best measure. Poison ivy plant can be recognized by the number of leaves. Generally, poison ivy has three  leaves with flowering branches on a single stem. Diphenhydramine may be purchased over the counter and used as needed for itching. Do not drive with this medication if it makes you drowsy.Ask your caregiver about medication for children. SEEK MEDICAL CARE IF:  Open sores develop.  Redness spreads beyond area of rash.  You notice purulent (pus-like) discharge.  You have increased pain.  Other signs of infection develop (such as fever). Document Released: 11/20/2000 Document Revised: 02/15/2012 Document Reviewed: 05/03/2009 [facility] Patient Information 2015 [place], [place]. This information is not intended to replace advice given to you by your health care provider. Make sure you discuss any questions you have with your health care provider.

## 2015-08-10 NOTE — ED Notes (Signed)
Robin PA at bedside.  

## 2016-03-17 ENCOUNTER — Ambulatory Visit (INDEPENDENT_AMBULATORY_CARE_PROVIDER_SITE_OTHER): Payer: BLUE CROSS/BLUE SHIELD | Admitting: Family Medicine

## 2016-03-17 VITALS — BP 99/62 | HR 64 | Temp 97.7°F | Resp 16 | Ht 63.0 in | Wt 125.4 lb

## 2016-03-17 DIAGNOSIS — Z1389 Encounter for screening for other disorder: Secondary | ICD-10-CM

## 2016-03-17 DIAGNOSIS — K8020 Calculus of gallbladder without cholecystitis without obstruction: Secondary | ICD-10-CM

## 2016-03-17 DIAGNOSIS — Z1239 Encounter for other screening for malignant neoplasm of breast: Secondary | ICD-10-CM

## 2016-03-17 DIAGNOSIS — Z0000 Encounter for general adult medical examination without abnormal findings: Secondary | ICD-10-CM

## 2016-03-17 DIAGNOSIS — R160 Hepatomegaly, not elsewhere classified: Secondary | ICD-10-CM | POA: Diagnosis not present

## 2016-03-17 DIAGNOSIS — Z87898 Personal history of other specified conditions: Secondary | ICD-10-CM

## 2016-03-17 DIAGNOSIS — Z136 Encounter for screening for cardiovascular disorders: Secondary | ICD-10-CM

## 2016-03-17 DIAGNOSIS — Z113 Encounter for screening for infections with a predominantly sexual mode of transmission: Secondary | ICD-10-CM

## 2016-03-17 DIAGNOSIS — Z1383 Encounter for screening for respiratory disorder NEC: Secondary | ICD-10-CM

## 2016-03-17 DIAGNOSIS — M7712 Lateral epicondylitis, left elbow: Secondary | ICD-10-CM

## 2016-03-17 DIAGNOSIS — Z9889 Other specified postprocedural states: Secondary | ICD-10-CM | POA: Diagnosis not present

## 2016-03-17 DIAGNOSIS — Z8742 Personal history of other diseases of the female genital tract: Secondary | ICD-10-CM | POA: Diagnosis not present

## 2016-03-17 DIAGNOSIS — Z1329 Encounter for screening for other suspected endocrine disorder: Secondary | ICD-10-CM | POA: Diagnosis not present

## 2016-03-17 DIAGNOSIS — Z130 Encounter for screening for diseases of the blood and blood-forming organs and certain disorders involving the immune mechanism: Secondary | ICD-10-CM

## 2016-03-17 DIAGNOSIS — Z842 Family history of other diseases of the genitourinary system: Secondary | ICD-10-CM | POA: Diagnosis not present

## 2016-03-17 DIAGNOSIS — Z23 Encounter for immunization: Secondary | ICD-10-CM | POA: Diagnosis not present

## 2016-03-17 DIAGNOSIS — Z8489 Family history of other specified conditions: Secondary | ICD-10-CM

## 2016-03-17 LAB — POCT URINALYSIS DIP (MANUAL ENTRY)
Bilirubin, UA: NEGATIVE
Glucose, UA: NEGATIVE
Ketones, POC UA: NEGATIVE
Leukocytes, UA: NEGATIVE
Nitrite, UA: NEGATIVE
PROTEIN UA: NEGATIVE
RBC UA: NEGATIVE
SPEC GRAV UA: 1.015
UROBILINOGEN UA: 0.2
pH, UA: 8

## 2016-03-17 LAB — POCT WET + KOH PREP
Trich by wet prep: ABSENT
Yeast by KOH: ABSENT
Yeast by wet prep: ABSENT

## 2016-03-17 LAB — POC MICROSCOPIC URINALYSIS (UMFC): Mucus: ABSENT

## 2016-03-17 MED ORDER — MELOXICAM 15 MG PO TABS: 15.0000 mg | ORAL_TABLET | Freq: Every day | ORAL | Status: DC

## 2016-03-17 NOTE — Progress Notes (Signed)
Subjective:  By signing my name below, I, [name], attest that this documentation has been prepared under the direction and in the presence of [name], MD. Electronically Signed: [name], Scribe. 03/17/2016 , 4:28 PM .  Patient was seen in Room 13 .   Patient ID: Donna Stephens, female    DOB: Apr 02, 1971, 45 y.o.   MRN: [id number] Chief Complaint  Patient presents with  . Annual Exam    with pap   . Elbow Pain    left elbow; x1 month   HPI Donna Stephens is a 45 y.o. female who presents to [facility] for annual physical.  She was seen for full physical in Dec 2014. At that time, her work up was largely negative; she did have some microscopic hematuria. She also has intermittent symptoms from gallstones. She had multiple normal pap smears in [place], first and last done 2.5 years ago. It was negative and negative high risk HPV, repeat December 19th; all other lipid labs, cbc, and tsh were normal.   RUQ ultrasound at that time showed multiple gallstones but no cholecystitis, but showed a hyperechoic mass in the liver. Mass most commonly may reflect a hemangioma of the liver but the appearance is not specific. But if any concern, MRI with and without contrast would be recommended. If not, repeat liver ultrasound in 6 months.   Her last meal was at 1:30PM.   Elbow pain She was carrying her younger son for a while about a month ago and noticed some pain in her left elbow. She has intermittent pain with certain rotations and gripping. Last year, she had similar issues in her right arm, and was resolved after a few months.   Thyroid She had thyroidectomy done 10 years ago due to a lesion found. She wants to check if anything is grown back.   Family history - GU Her mother had history of uterine tumor, and had hysterectomy. She would like to have this checked. She denies any dysuria, or flank pain.   She went back to [place] 2 years ago and was informed having ovarian cysts.   Kidney She had  a negative ultrasound of her kidneys in [place].   Immunizations She denies knowledge of last tetanus vaccine.   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 Her older son translated for her in the office.   Past Medical History  Diagnosis Date  . Thyroid disease     lesion-benign  . Gallstones     diagnosed in [place]   Prior to Admission medications   Not on File   No Known Allergies  Past Surgical History  Procedure Laterality Date  . Cesarean section    . Hemi thyroidectomy Right     benign   History reviewed. No pertinent family history. Social History   Social History  . Marital Status: Married    Spouse Name: N/A  . Number of Children: 2  . Years of Education: 9th grade   Occupational History  . office      takes phone orders   Social History Main Topics  . Smoking status: Never Smoker   . Smokeless tobacco: Never Used  . Alcohol Use: No  . Drug Use: No  . Sexual Activity:    Partners: Male    Birth Control/ Protection: Pill   Other Topics Concern  . None   Social History Narrative   ** Merged History Encounter **       From [place], came [place] the  [place] in 2009.  One son was born there, one in the [place].  She lives with her husband and their 2 sons.    Review of Systems  Constitutional: Negative for fever, appetite change and fatigue.  Cardiovascular: Negative for chest pain.  Gastrointestinal: Negative for nausea, vomiting and diarrhea.  Genitourinary: Negative for dysuria and flank pain.  Musculoskeletal: Positive for arthralgias. Negative for myalgias and joint swelling.  Skin: Negative for rash and wound.  All other systems reviewed and are negative.     Objective:   Physical Exam  Constitutional: She is oriented to person, place, and time. She appears well-developed and well-nourished. No distress.  HENT:  Head: Normocephalic and atraumatic.  Right Ear: Tympanic membrane normal.  Left Ear: Tympanic membrane is retracted.  Nose:  Nose normal.  Mouth/Throat: Oropharynx is clear and moist.  Eyes: EOM are normal. Pupils are equal, round, and reactive to light.  Neck: Neck supple. Thyromegaly (left normal; right surgically absent) present.  Cardiovascular: Normal rate, regular rhythm, S1 normal, S2 normal and normal heart sounds.   Pulses:      Dorsalis pedis pulses are 2+ on the right side, and 2+ on the left side.  Pulmonary/Chest: Effort normal and breath sounds normal. No respiratory distress. She has no decreased breath sounds.  Breast exam: complaints of tenderness left 3-6 o'clock edges of her breast, and right 9 o'clock  Poorly defined mobile cyst-like mass at the right lateral 9 o'clock breast; left breast normal  Abdominal: Soft. Bowel sounds are normal. There is no tenderness. There is no CVA tenderness.  Genitourinary:  Pelvic exam normal; uterus nontender, not enlarged, normal adnexa  Musculoskeletal: Normal range of motion.  Tender over left lateral epicondyle  Lymphadenopathy:    She has no cervical adenopathy.    She has no axillary adenopathy.  Neurological: She is alert and oriented to person, place, and time.  Skin: Skin is warm and dry.  Psychiatric: She has a normal mood and affect. Her behavior is normal.  Nursing note and vitals reviewed.  BP 99/62 mmHg  Pulse 64  Temp(Src) 97.7 F (36.5 C) (Oral)  Resp 16  Ht 5\' 3"  (1.6 m)  Wt 125 lb 6.4 oz (56.881 kg)  BMI 22.22 kg/m2  SpO2 98%  LMP 03/04/2016   Results for orders placed or performed in visit on 03/17/16  POCT urinalysis dipstick  Result Value Ref Range   Color, UA yellow yellow   Clarity, UA clear clear   Glucose, UA negative negative   Bilirubin, UA negative negative   Ketones, POC UA negative negative   Spec Grav, UA 1.015    Blood, UA negative negative   pH, UA 8.0    Protein Ur, POC negative negative   Urobilinogen, UA 0.2    Nitrite, UA Negative Negative   Leukocytes, UA Negative Negative  POCT Microscopic  Urinalysis ([facility])  Result Value Ref Range   WBC,UR,HPF,POC None None WBC/hpf   RBC,UR,HPF,POC None None RBC/hpf   Bacteria Few (A) None, Too numerous to count   Mucus Absent Absent   Epithelial Cells, UR Per Microscopy Few (A) None, Too numerous to count cells/hpf   Amorphous moderate   POCT Wet + KOH Prep  Result Value Ref Range   Yeast by KOH Absent Present, Absent   Yeast by wet prep Absent Present, Absent   WBC by wet prep None None, Few, Too numerous to count   Clue Cells Wet Prep HPF POC None None, Too numerous to  count   Trich by wet prep Absent Present, Absent   Bacteria Wet Prep HPF POC Few None, Few, Too numerous to count   Epithelial Cells By [employer] Pref ([facility]) Few None, Few, Too numerous to count   RBC,UR,HPF,POC None None RBC/hpf       Assessment & Plan:   1. Annual physical exam   2. Routine screening for STI (sexually transmitted infection)   3. Screening for breast cancer   4. Screening for cardiovascular, respiratory, and genitourinary diseases   5. Screening for deficiency anemia   6. Screening for thyroid disorder   7. Liver mass   8. Gallstones   9. Family history of uterine fibroid   10. History of ovarian cyst   11. History of thyroid surgery   12. Lateral epicondylitis (tennis elbow), left     Orders Placed This Encounter  Procedures  . US Abdomen Limited RUQ    F/u liver mass Wt-125/PF 11/16/13 [facility] Mandarin speaking-interpreter requested Ins-bcbs/np and pt's son Epic order    Standing Status: Future     Number of Occurrences:      Standing Expiration Date: 05/17/2017    Order Specific Question:  Reason for Exam (SYMPTOM  OR DIAGNOSIS REQUIRED)    Answer:  f/u liver mass - suspected hemagioma from 2014    Order Specific Question:  Preferred imaging location?    Answer:  [facility]  . US Soft Tissue Head/Neck    Wt-125/ Mandarin speaking-interpreter requested Ins-bcbs/np and pt's son Epic order     Standing Status: Future     Number  of Occurrences:      Standing Expiration Date: 05/17/2017    Order Specific Question:  Reason for Exam (SYMPTOM  OR DIAGNOSIS REQUIRED)    Answer:  history of right thyroid lobectomy several yrs prior in [place]    Order Specific Question:  Preferred imaging location?    Answer:  [facility]  . Tdap vaccine greater than or equal to 7yo IM  . CBC  . Comprehensive metabolic panel  . Thyroid Panel With TSH  . POCT urinalysis dipstick  . POCT Microscopic Urinalysis ([facility])  . POCT Wet + KOH Prep    Meds ordered this encounter  Medications  . meloxicam (MOBIC) 15 MG tablet    Sig: Take 1 tablet (15 mg total) by mouth daily.    Dispense:  30 tablet    Refill:  0    I personally performed the services described in this documentation, which was scribed in my presence. The recorded information has been reviewed and considered, and addended by me as needed.  [name], MD MPH

## 2016-03-17 NOTE — Patient Instructions (Addendum)
?? ????????????????????????????????????????????????????????????????????????????????????????????????????????????????????????? ?? ??????????????????????????????????????????????????? ???7????????? ????????????????????????????????? ????????????????????????????????????????? ??? ????????2?3????10?15???????????????????????????????? ????????????????????????????????????????????????????  Health Maintenance, Female Adopting a healthy lifestyle and getting preventive care can go a long way to promote health and wellness. Talk with your health care provider about what schedule of regular examinations is right for you. This is a good chance for you to check in with your provider about disease prevention and staying healthy. In between checkups, there are plenty of things you can do on your own. Experts have done a lot of research about which lifestyle changes and preventive measures are most likely to keep you healthy. Ask your health care provider for more information. WEIGHT AND DIET  Eat a healthy diet  Be sure to include plenty of vegetables, fruits, low-fat dairy products, and lean protein.  Do not eat a lot of foods high in solid fats, added sugars, or salt.  Get regular exercise. This is one of the most important things you can do for your health.  Most adults should exercise for at least 150 minutes each week. The exercise should increase your heart rate and make you sweat (moderate-intensity exercise).  Most adults should also do strengthening exercises at least twice a week. This is in addition to the moderate-intensity exercise.  Maintain a healthy weight  Body mass index (BMI) is a measurement that can be used to identify possible weight problems. It estimates body fat based on height and weight. Your health care provider can help determine your BMI and help you achieve or maintain a healthy weight.  For females 2 years of age and older:   A BMI below 18.5 is considered  underweight.  A BMI of 18.5 to 24.9 is normal.  A BMI of 25 to 29.9 is considered overweight.  A BMI of 30 and above is considered obese.  Watch levels of cholesterol and blood lipids  You should start having your blood tested for lipids and cholesterol at 45 years of age, then have this test every 5 years.  You may need to have your cholesterol levels checked more often if:  Your lipid or cholesterol levels are high.  You are older than 45 years of age.  You are at high risk for heart disease.  CANCER SCREENING   Lung Cancer  Lung cancer screening is recommended for adults 9-61 years old who are at high risk for lung cancer because of a history of smoking.  A yearly low-dose CT scan of the lungs is recommended for people who:  Currently smoke.  Have quit within the past 15 years.  Have at least a 30-pack-year history of smoking. A pack year is smoking an average of one pack of cigarettes a day for 1 year.  Yearly screening should continue until it has been 15 years since you quit.  Yearly screening should stop if you develop a health problem that would prevent you from having lung cancer treatment.  Breast Cancer  Practice breast self-awareness. This means understanding how your breasts normally appear and feel.  It also means doing regular breast self-exams. Let your health care provider know about any changes, no matter how small.  If you are in your 20s or 30s, you should have a clinical breast exam (CBE) by a health care provider every 1-3 years as part of a regular health exam.  If you are 53 or older, have a CBE every year. Also consider having a breast X-ray (mammogram) every year.  If you have a family history of breast cancer, talk  to your health care provider about genetic screening.  If you are at high risk for breast cancer, talk to your health care provider about having an MRI and a mammogram every year.  Breast cancer gene (BRCA) assessment is  recommended for women who have family members with BRCA-related cancers. BRCA-related cancers include:  Breast.  Ovarian.  Tubal.  Peritoneal cancers.  Results of the assessment will determine the need for genetic counseling and BRCA1 and BRCA2 testing. Cervical Cancer Your health care provider may recommend that you be screened regularly for cancer of the pelvic organs (ovaries, uterus, and vagina). This screening involves a pelvic examination, including checking for microscopic changes to the surface of your cervix (Pap test). You may be encouraged to have this screening done every 3 years, beginning at age 55.  For women ages 67-65, health care providers may recommend pelvic exams and Pap testing every 3 years, or they may recommend the Pap and pelvic exam, combined with testing for human papilloma virus (HPV), every 5 years. Some types of HPV increase your risk of cervical cancer. Testing for HPV may also be done on women of any age with unclear Pap test results.  Other health care providers may not recommend any screening for nonpregnant women who are considered low risk for pelvic cancer and who do not have symptoms. Ask your health care provider if a screening pelvic exam is right for you.  If you have had past treatment for cervical cancer or a condition that could lead to cancer, you need Pap tests and screening for cancer for at least 20 years after your treatment. If Pap tests have been discontinued, your risk factors (such as having a new sexual partner) need to be reassessed to determine if screening should resume. Some women have medical problems that increase the chance of getting cervical cancer. In these cases, your health care provider may recommend more frequent screening and Pap tests. Colorectal Cancer  This type of cancer can be detected and often prevented.  Routine colorectal cancer screening usually begins at 45 years of age and continues through 45 years of  age.  Your health care provider may recommend screening at an earlier age if you have risk factors for colon cancer.  Your health care provider may also recommend using home test kits to check for hidden blood in the stool.  A small camera at the end of a tube can be used to examine your colon directly (sigmoidoscopy or colonoscopy). This is done to check for the earliest forms of colorectal cancer.  Routine screening usually begins at age 84.  Direct examination of the colon should be repeated every 5-10 years through 45 years of age. However, you may need to be screened more often if early forms of precancerous polyps or small growths are found. Skin Cancer  Check your skin from head to toe regularly.  Tell your health care provider about any new moles or changes in moles, especially if there is a change in a mole's shape or color.  Also tell your health care provider if you have a mole that is larger than the size of a pencil eraser.  Always use sunscreen. Apply sunscreen liberally and repeatedly throughout the day.  Protect yourself by wearing long sleeves, pants, a wide-brimmed hat, and sunglasses whenever you are outside. HEART DISEASE, DIABETES, AND HIGH BLOOD PRESSURE   High blood pressure causes heart disease and increases the risk of stroke. High blood pressure is more likely to develop  in:  People who have blood pressure in the high end of the normal range (130-139/85-89 mm Hg).  People who are overweight or obese.  People who are African American.  If you are 9-67 years of age, have your blood pressure checked every 3-5 years. If you are 66 years of age or older, have your blood pressure checked every year. You should have your blood pressure measured twice--once when you are at a hospital or clinic, and once when you are not at a hospital or clinic. Record the average of the two measurements. To check your blood pressure when you are not at a hospital or clinic, you can  use:  An automated blood pressure machine at a pharmacy.  A home blood pressure monitor.  If you are between 7 years and 107 years old, ask your health care provider if you should take aspirin to prevent strokes.  Have regular diabetes screenings. This involves taking a blood sample to check your fasting blood sugar level.  If you are at a normal weight and have a low risk for diabetes, have this test once every three years after 45 years of age.  If you are overweight and have a high risk for diabetes, consider being tested at a younger age or more often. PREVENTING INFECTION  Hepatitis B  If you have a higher risk for hepatitis B, you should be screened for this virus. You are considered at high risk for hepatitis B if:  You were born in a country where hepatitis B is common. Ask your health care provider which countries are considered high risk.  Your parents were born in a high-risk country, and you have not been immunized against hepatitis B (hepatitis B vaccine).  You have HIV or AIDS.  You use needles to inject street drugs.  You live with someone who has hepatitis B.  You have had sex with someone who has hepatitis B.  You get hemodialysis treatment.  You take certain medicines for conditions, including cancer, organ transplantation, and autoimmune conditions. Hepatitis C  Blood testing is recommended for:  Everyone born from [birth year] through 1965.  Anyone with known risk factors for hepatitis C. Sexually transmitted infections (STIs)  You should be screened for sexually transmitted infections (STIs) including gonorrhea and chlamydia if:  You are sexually active and are younger than 45 years of age.  You are older than 45 years of age and your health care provider tells you that you are at risk for this type of infection.  Your sexual activity has changed since you were last screened and you are at an increased risk for chlamydia or gonorrhea. Ask your health care  provider if you are at risk.  If you do not have HIV, but are at risk, it may be recommended that you take a prescription medicine daily to prevent HIV infection. This is called pre-exposure prophylaxis (PrEP). You are considered at risk if:  You are sexually active and do not regularly use condoms or know the HIV status of your partner(s).  You take drugs by injection.  You are sexually active with a partner who has HIV. Talk with your health care provider about whether you are at high risk of being infected with HIV. If you choose to begin PrEP, you should first be tested for HIV. You should then be tested every 3 months for as long as you are taking PrEP.  PREGNANCY   If you are premenopausal and you may become pregnant, ask  your health care provider about preconception counseling.  If you may become pregnant, take 400 to 800 micrograms (mcg) of folic acid every day.  If you want to prevent pregnancy, talk to your health care provider about birth control (contraception). OSTEOPOROSIS AND MENOPAUSE   Osteoporosis is a disease in which the bones lose minerals and strength with aging. This can result in serious bone fractures. Your risk for osteoporosis can be identified using a bone density scan.  If you are 75 years of age or older, or if you are at risk for osteoporosis and fractures, ask your health care provider if you should be screened.  Ask your health care provider whether you should take a calcium or vitamin D supplement to lower your risk for osteoporosis.  Menopause may have certain physical symptoms and risks.  Hormone replacement therapy may reduce some of these symptoms and risks. Talk to your health care provider about whether hormone replacement therapy is right for you.  HOME CARE INSTRUCTIONS   Schedule regular health, dental, and eye exams.  Stay current with your immunizations.   Do not use any tobacco products including cigarettes, chewing tobacco, or  electronic cigarettes.  If you are pregnant, do not drink alcohol.  If you are breastfeeding, limit how much and how often you drink alcohol.  Limit alcohol intake to no more than 1 drink per day for nonpregnant women. One drink equals 12 ounces of beer, 5 ounces of wine, or 1 ounces of hard liquor.  Do not use street drugs.  Do not share needles.  Ask your health care provider for help if you need support or information about quitting drugs.  Tell your health care provider if you often feel depressed.  Tell your health care provider if you have ever been abused or do not feel safe at home.   This information is not intended to replace advice given to you by your health care provider. Make sure you discuss any questions you have with your health care provider.   Document Released: 06/08/2011 Document Revised: 12/14/2014 Document Reviewed: 10/25/2013 Elsevier Interactive Patient Education 2016 [employer] Uterine Fibroids Uterine fibroids are tissue masses (tumors). They are also called leiomyomas. They can develop inside of a woman's womb (uterus). They can grow very large. Fibroids are not cancerous (benign). Most fibroids do not require medical treatment. HOME CARE  Keep all follow-up visits as told by your doctor. This is important.  Take medicines only as told by your doctor.  If you were prescribed a hormone treatment, take the hormone medicines exactly as told.  Do not take aspirin. It can cause bleeding.  Ask your doctor about taking iron pills and increasing the amount of dark green, leafy vegetables in your diet. These actions can help to boost your blood iron levels.  Pay close attention to your period. Tell your doctor about any changes, such as:  Increased blood flow. This may require you to use more pads or tampons than usual per month.  A change in the number of days that your period lasts per month.  A change in symptoms that come with your period, such as  back pain or cramping in your belly area (abdomen). GET HELP IF:  You have pain in your back or the area between your hip bones (pelvic area) that is not controlled by medicines.  You have pain in your abdomen that is not controlled with medicines.  You have an increase in bleeding between and during periods.  You soak tampons or pads in a half hour or less.  You feel lightheaded.  You feel extra tired.  You feel weak. GET HELP RIGHT AWAY IF:   You pass out (faint).  You have a sudden increase in pelvic pain.   This information is not intended to replace advice given to you by your health care provider. Make sure you discuss any questions you have with your health care provider.   Document Released: 12/26/2010 Document Revised: 12/14/2014 Document Reviewed: 05/22/2014 Elsevier Interactive Patient Education 2016 [employer] Lateral Epicondylitis With Rehab Lateral epicondylitis involves inflammation and pain around the outer portion of the elbow. The pain is caused by inflammation of the tendons in the forearm that bring back (extend) the wrist. Lateral epicondylitis is also called tennis elbow, because it is very common in tennis players. However, it may occur in any individual who extends the wrist repetitively. If lateral epicondylitis is left untreated, it may become a chronic problem. SYMPTOMS   Pain, tenderness, and inflammation on the outer (lateral) side of the elbow.  Pain or weakness with gripping activities.  Pain that increases with wrist-twisting motions (playing tennis, using a screwdriver, opening a door or a jar).  Pain with lifting objects, including a coffee cup. CAUSES  Lateral epicondylitis is caused by inflammation of the tendons that extend the wrist. Causes of injury may include:  Repetitive stress and strain on the muscles and tendons that extend the wrist.  Sudden change in activity level or intensity.  Incorrect grip in racquet  sports.  Incorrect grip size of racquet (often too large).  Incorrect hitting position or technique (usually backhand, leading with the elbow).  Using a racket that is too heavy. RISK INCREASES WITH:  Sports or occupations that require repetitive and/or strenuous forearm and wrist movements (tennis, squash, racquetball, carpentry).  Poor wrist and forearm strength and flexibility.  Failure to warm up properly before activity.  Resuming activity before healing, rehabilitation, and conditioning are complete. PREVENTION   Warm up and stretch properly before activity.  Maintain physical fitness:  Strength, flexibility, and endurance.  Cardiovascular fitness.  Wear and use properly fitted equipment.  Learn and use proper technique and have a coach correct improper technique.  Wear a tennis elbow (counterforce) brace. PROGNOSIS  The course of this condition depends on the degree of the injury. If treated properly, acute cases (symptoms lasting less than 4 weeks) are often resolved in 2 to 6 weeks. Chronic (longer lasting cases) often resolve in 3 to 6 months but may require physical therapy. RELATED COMPLICATIONS   Frequently recurring symptoms, resulting in a chronic problem. Properly treating the problem the first time decreases frequency of recurrence.  Chronic inflammation, scarring tendon degeneration, and partial tendon tear, requiring surgery.  Delayed healing or resolution of symptoms. TREATMENT  Treatment first involves the use of ice and medicine to reduce pain and inflammation. Strengthening and stretching exercises may help reduce discomfort if performed regularly. These exercises may be performed at home if the condition is an acute injury. Chronic cases may require a referral to a physical therapist for evaluation and treatment. Your caregiver may advise a corticosteroid injection to help reduce inflammation. Rarely, surgery is needed. MEDICATION  If pain medicine  is needed, nonsteroidal anti-inflammatory medicines (aspirin and ibuprofen), or other minor pain relievers (acetaminophen), are often advised.  Do not take pain medicine for 7 days before surgery.  Prescription pain relievers may be given, if your caregiver thinks they are needed. Use only  as directed and only as much as you need.  Corticosteroid injections may be recommended. These injections should be reserved only for the most severe cases, because they can only be given a certain number of times. HEAT AND COLD  Cold treatment (icing) should be applied for 10 to 15 minutes every 2 to 3 hours for inflammation and pain, and immediately after activity that aggravates your symptoms. Use ice packs or an ice massage.  Heat treatment may be used before performing stretching and strengthening activities prescribed by your caregiver, physical therapist, or athletic trainer. Use a heat pack or a warm water soak. SEEK MEDICAL CARE IF: Symptoms get worse or do not improve in 2 weeks, despite treatment. EXERCISES  RANGE OF MOTION (ROM) AND STRETCHING EXERCISES - Epicondylitis, Lateral (Tennis Elbow) These exercises may help you when beginning to rehabilitate your injury. Your symptoms may go away with or without further involvement from your physician, physical therapist, or athletic trainer. While completing these exercises, remember:   Restoring tissue flexibility helps normal motion to return to the joints. This allows healthier, less painful movement and activity.  An effective stretch should be held for at least 30 seconds.  A stretch should never be painful. You should only feel a gentle lengthening or release in the stretched tissue. RANGE OF MOTION - Wrist Flexion, Active-Assisted  Extend your right / left elbow with your fingers pointing down.*  Gently pull the back of your hand towards you, until you feel a gentle stretch on the top of your forearm.  Hold this position for __________  seconds. Repeat __________ times. Complete this exercise __________ times per day.  *If directed by your physician, physical therapist or athletic trainer, complete this stretch with your elbow bent, rather than extended. RANGE OF MOTION - Wrist Extension, Active-Assisted  Extend your right / left elbow and turn your palm upwards.*  Gently pull your palm and fingertips back, so your wrist extends and your fingers point more toward the ground.  You should feel a gentle stretch on the inside of your forearm.  Hold this position for __________ seconds. Repeat __________ times. Complete this exercise __________ times per day. *If directed by your physician, physical therapist or athletic trainer, complete this stretch with your elbow bent, rather than extended. STRETCH - Wrist Flexion  Place the back of your right / left hand on a tabletop, leaving your elbow slightly bent. Your fingers should point away from your body.  Gently press the back of your hand down onto the table by straightening your elbow. You should feel a stretch on the top of your forearm.  Hold this position for __________ seconds. Repeat __________ times. Complete this stretch __________ times per day.  STRETCH - Wrist Extension   Place your right / left fingertips on a tabletop, leaving your elbow slightly bent. Your fingers should point backwards.  Gently press your fingers and palm down onto the table by straightening your elbow. You should feel a stretch on the inside of your forearm.  Hold this position for __________ seconds. Repeat __________ times. Complete this stretch __________ times per day.  STRENGTHENING EXERCISES - Epicondylitis, Lateral (Tennis Elbow) These exercises may help you when beginning to rehabilitate your injury. They may resolve your symptoms with or without further involvement from your physician, physical therapist, or athletic trainer. While completing these exercises, remember:   Muscles  can gain both the endurance and the strength needed for everyday activities through controlled exercises.  Complete these exercises  as instructed by your physician, physical therapist or athletic trainer. Increase the resistance and repetitions only as guided.  You may experience muscle soreness or fatigue, but the pain or discomfort you are trying to eliminate should never worsen during these exercises. If this pain does get worse, stop and make sure you are following the directions exactly. If the pain is still present after adjustments, discontinue the exercise until you can discuss the trouble with your caregiver. STRENGTH - Wrist Flexors  Sit with your right / left forearm palm-up and fully supported on a table or countertop. Your elbow should be resting below the height of your shoulder. Allow your wrist to extend over the edge of the surface.  Loosely holding a __________ weight, or a piece of rubber exercise band or tubing, slowly curl your hand up toward your forearm.  Hold this position for __________ seconds. Slowly lower the wrist back to the starting position in a controlled manner. Repeat __________ times. Complete this exercise __________ times per day.  STRENGTH - Wrist Extensors  Sit with your right / left forearm palm-down and fully supported on a table or countertop. Your elbow should be resting below the height of your shoulder. Allow your wrist to extend over the edge of the surface.  Loosely holding a __________ weight, or a piece of rubber exercise band or tubing, slowly curl your hand up toward your forearm.  Hold this position for __________ seconds. Slowly lower the wrist back to the starting position in a controlled manner. Repeat __________ times. Complete this exercise __________ times per day.  STRENGTH - Ulnar Deviators  Stand with a ____________________ weight in your right / left hand, or sit while holding a rubber exercise band or tubing, with your healthy arm  supported on a table or countertop.  Move your wrist, so that your pinkie travels toward your forearm and your thumb moves away from your forearm.  Hold this position for __________ seconds and then slowly lower the wrist back to the starting position. Repeat __________ times. Complete this exercise __________ times per day STRENGTH - Radial Deviators  Stand with a ____________________ weight in your right / left hand, or sit while holding a rubber exercise band or tubing, with your injured arm supported on a table or countertop.  Raise your hand upward in front of you or pull up on the rubber tubing.  Hold this position for __________ seconds and then slowly lower the wrist back to the starting position. Repeat __________ times. Complete this exercise __________ times per day. STRENGTH - Forearm Supinators   Sit with your right / left forearm supported on a table, keeping your elbow below shoulder height. Rest your hand over the edge, palm down.  Gently grip a hammer or a soup ladle.  Without moving your elbow, slowly turn your palm and hand upward to a "thumbs-up" position.  Hold this position for __________ seconds. Slowly return to the starting position. Repeat __________ times. Complete this exercise __________ times per day.  STRENGTH - Forearm Pronators   Sit with your right / left forearm supported on a table, keeping your elbow below shoulder height. Rest your hand over the edge, palm up.  Gently grip a hammer or a soup ladle.  Without moving your elbow, slowly turn your palm and hand upward to a "thumbs-up" position.  Hold this position for __________ seconds. Slowly return to the starting position. Repeat __________ times. Complete this exercise __________ times per day.  STRENGTH - Grip  Grasp a tennis ball, a dense sponge, or a large, rolled sock in your hand.  Squeeze as hard as you can, without increasing any pain.  Hold this position for __________ seconds.  Release your grip slowly. Repeat __________ times. Complete this exercise __________ times per day.  STRENGTH - Elbow Extensors, Isometric  Stand or sit upright, on a firm surface. Place your right / left arm so that your palm faces your stomach, and it is at the height of your waist.  Place your opposite hand on the underside of your forearm. Gently push up as your right / left arm resists. Push as hard as you can with both arms, without causing any pain or movement at your right / left elbow. Hold this stationary position for __________ seconds. Gradually release the tension in both arms. Allow your muscles to relax completely before repeating.   This information is not intended to replace advice given to you by your health care provider. Make sure you discuss any questions you have with your health care provider.   Document Released: 11/23/2005 Document Revised: 12/14/2014 Document Reviewed: 03/07/2009 Elsevier Interactive Patient Education [employer].

## 2016-03-18 ENCOUNTER — Encounter (HOSPITAL_BASED_OUTPATIENT_CLINIC_OR_DEPARTMENT_OTHER): Payer: Self-pay | Admitting: *Deleted

## 2016-03-18 LAB — CBC
HCT: 42.6 % (ref 35.0–45.0)
Hemoglobin: 14.1 g/dL (ref 11.7–15.5)
MCH: 30.1 pg (ref 27.0–33.0)
MCHC: 33.1 g/dL (ref 32.0–36.0)
MCV: 91 fL (ref 80.0–100.0)
MPV: 9.3 fL (ref 7.5–12.5)
PLATELETS: 287 10*3/uL (ref 140–400)
RBC: 4.68 MIL/uL (ref 3.80–5.10)
RDW: 13 % (ref 11.0–15.0)
WBC: 6.6 10*3/uL (ref 3.8–10.8)

## 2016-03-18 LAB — THYROID PANEL WITH TSH
Free Thyroxine Index: 2.3 (ref 1.4–3.8)
T3 Uptake: 31 % (ref 22–35)
T4 TOTAL: 7.5 ug/dL (ref 4.5–12.0)
TSH: 2.29 m[IU]/L

## 2016-03-18 LAB — COMPREHENSIVE METABOLIC PANEL
ALT: 21 U/L (ref 6–29)
AST: 18 U/L (ref 10–30)
Albumin: 4.3 g/dL (ref 3.6–5.1)
Alkaline Phosphatase: 32 U/L — ABNORMAL LOW (ref 33–115)
BUN: 14 mg/dL (ref 7–25)
CHLORIDE: 104 mmol/L (ref 98–110)
CO2: 28 mmol/L (ref 20–31)
Calcium: 8.9 mg/dL (ref 8.6–10.2)
Creat: 0.51 mg/dL (ref 0.50–1.10)
Glucose, Bld: 92 mg/dL (ref 65–99)
POTASSIUM: 4.4 mmol/L (ref 3.5–5.3)
Sodium: 137 mmol/L (ref 135–146)
TOTAL PROTEIN: 7.2 g/dL (ref 6.1–8.1)
Total Bilirubin: 0.4 mg/dL (ref 0.2–1.2)

## 2016-03-25 ENCOUNTER — Telehealth: Payer: Self-pay

## 2016-03-25 ENCOUNTER — Other Ambulatory Visit: Payer: Self-pay

## 2016-03-25 NOTE — Telephone Encounter (Signed)
In her exam is documented breast masses and tenderness and then I had ordered a diagnostic mammogram and US.

## 2016-03-25 NOTE — Telephone Encounter (Signed)
Dr Clelia CroftShaw did you want this pt to have a screening mammogram or diagnostic? Did she complain of breast pain or anything. [facility] sent us a fax asking us. Please advise.

## 2016-04-01 ENCOUNTER — Other Ambulatory Visit: Payer: Self-pay | Admitting: Family Medicine

## 2016-04-01 DIAGNOSIS — N6310 Unspecified lump in the right breast, unspecified quadrant: Principal | ICD-10-CM

## 2016-04-01 DIAGNOSIS — N6315 Unspecified lump in the right breast, overlapping quadrants: Secondary | ICD-10-CM

## 2016-04-02 NOTE — Telephone Encounter (Signed)
Dr. Clelia CroftShaw has placed referral please see in Chart Review as well as her previous message

## 2016-04-03 ENCOUNTER — Ambulatory Visit
Admission: RE | Admit: 2016-04-03 | Discharge: 2016-04-03 | Disposition: A | Discharge status: Home / Self Care | Payer: BLUE CROSS/BLUE SHIELD | Source: Ambulatory Visit | Attending: Family Medicine | Admitting: Family Medicine

## 2016-04-03 DIAGNOSIS — N6315 Unspecified lump in the right breast, overlapping quadrants: Secondary | ICD-10-CM

## 2016-04-03 DIAGNOSIS — N6310 Unspecified lump in the right breast, unspecified quadrant: Principal | ICD-10-CM

## 2016-04-06 ENCOUNTER — Ambulatory Visit
Admission: RE | Admit: 2016-04-06 | Discharge: 2016-04-06 | Disposition: A | Discharge status: Home / Self Care | Payer: BLUE CROSS/BLUE SHIELD | Source: Ambulatory Visit | Attending: Family Medicine | Admitting: Family Medicine

## 2016-04-06 DIAGNOSIS — Z9889 Other specified postprocedural states: Secondary | ICD-10-CM

## 2016-04-06 DIAGNOSIS — Z1329 Encounter for screening for other suspected endocrine disorder: Secondary | ICD-10-CM

## 2016-04-06 DIAGNOSIS — Z0000 Encounter for general adult medical examination without abnormal findings: Secondary | ICD-10-CM

## 2016-04-06 DIAGNOSIS — K8020 Calculus of gallbladder without cholecystitis without obstruction: Secondary | ICD-10-CM

## 2016-04-06 DIAGNOSIS — R160 Hepatomegaly, not elsewhere classified: Secondary | ICD-10-CM

## 2016-04-07 ENCOUNTER — Encounter: Payer: Self-pay | Admitting: Family Medicine

## 2016-04-09 ENCOUNTER — Telehealth: Payer: Self-pay

## 2016-04-09 NOTE — Telephone Encounter (Signed)
-----   Message from Eva N Shaw, MD sent at 04/07/2016  5:26 PM EDT ----- In her liver there is a dilation of blood vessels - called a hemangioma - that is unchanged from prior and is not going to cause pt any harm, does not need any further eval.  She does have a large gallstone - if it is bothering her enough to have her gallbladder removed, happy to refer to surgery at her convenience - just call. 

## 2016-04-09 NOTE — Telephone Encounter (Signed)
LMOVM to return call to clinic for test results.

## 2016-04-09 NOTE — Telephone Encounter (Signed)
LMOVM for interpreter Toniann FailWendy [phone] as listed on pt demographics.  Asked her to call office and get test results for patient.

## 2016-04-09 NOTE — Telephone Encounter (Signed)
-----   Message from Sherren MochaEva N Shaw, MD sent at 04/07/2016  5:26 PM EDT ----- In her liver there is a dilation of blood vessels - called a hemangioma - that is unchanged from prior and is not going to cause pt any harm, does not need any further eval.  She does have a large gallstone - if it is bothering her enough to have her gallbladder removed, happy to refer to surgery at her convenience - just call.

## 2016-04-09 NOTE — Telephone Encounter (Signed)
-----   Message from Sherren MochaEva N Shaw, MD sent at 04/07/2016  5:10 PM EDT ----- Thyroid US is normal for pt - right side is still completely gone.  Left side has some tiny nodules but nothing concerning but may want to have them rechecked in several years to ensure unchanged.

## 2016-04-09 NOTE — Telephone Encounter (Signed)
LMOVM for interpreter Wendy [phone] as listed on pt demographics.  Asked her to call office and get test results for patient. 

## 2016-04-09 NOTE — Telephone Encounter (Signed)
Interpreter Toniann FailWendy [phone] returned call for imaging results.  Pt on conference line during conversation. Messages given per Dr. Clelia CroftShaw:  1) dilated blood vessels/hemangioma a finding but does not need follow up;  2) gall stone present - pt was aware and does not want any surgery at this time. 3) L/lobe thyroid nodules present and need to be rechecked annually to follow.  Pt aware also since R/lobe removed.

## 2016-05-14 ENCOUNTER — Encounter (HOSPITAL_BASED_OUTPATIENT_CLINIC_OR_DEPARTMENT_OTHER): Payer: Self-pay

## 2016-05-14 ENCOUNTER — Emergency Department (HOSPITAL_BASED_OUTPATIENT_CLINIC_OR_DEPARTMENT_OTHER)
Admission: EM | Admit: 2016-05-14 | Discharge: 2016-05-14 | Disposition: A | Discharge status: Home / Self Care | Payer: BLUE CROSS/BLUE SHIELD | Attending: Emergency Medicine | Admitting: Emergency Medicine

## 2016-05-14 DIAGNOSIS — Y999 Unspecified external cause status: Secondary | ICD-10-CM | POA: Insufficient documentation

## 2016-05-14 DIAGNOSIS — T63441A Toxic effect of venom of bees, accidental (unintentional), initial encounter: Secondary | ICD-10-CM

## 2016-05-14 DIAGNOSIS — Y939 Activity, unspecified: Secondary | ICD-10-CM | POA: Insufficient documentation

## 2016-05-14 DIAGNOSIS — S60861A Insect bite (nonvenomous) of right wrist, initial encounter: Secondary | ICD-10-CM | POA: Insufficient documentation

## 2016-05-14 DIAGNOSIS — X58XXXA Exposure to other specified factors, initial encounter: Secondary | ICD-10-CM | POA: Diagnosis not present

## 2016-05-14 DIAGNOSIS — Y929 Unspecified place or not applicable: Secondary | ICD-10-CM | POA: Diagnosis not present

## 2016-05-14 DIAGNOSIS — IMO0001 Reserved for inherently not codable concepts without codable children: Secondary | ICD-10-CM

## 2016-05-14 MED ORDER — DIPHENHYDRAMINE HCL 25 MG PO TABS: 25.0000 mg | ORAL_TABLET | Freq: Four times a day (QID) | ORAL | Status: DC

## 2016-05-14 MED ORDER — DIPHENHYDRAMINE HCL 25 MG PO CAPS
25.0000 mg | ORAL_CAPSULE | Freq: Once | ORAL | Status: AC
  Administered 2016-05-14: 25 mg via ORAL
  Filled 2016-05-14: qty 1

## 2016-05-14 MED ORDER — IBUPROFEN 400 MG PO TABS: 800.0000 mg | ORAL_TABLET | Freq: Three times a day (TID) | ORAL | Status: DC

## 2016-05-14 MED ORDER — IBUPROFEN 400 MG PO TABS
400.0000 mg | ORAL_TABLET | Freq: Once | ORAL | Status: AC
  Administered 2016-05-14: 400 mg via ORAL
  Filled 2016-05-14: qty 1

## 2016-05-14 NOTE — ED Notes (Signed)
Pt was stung by a bee at approximately 1900.  Has a small red area on right wrist, not allergic to bees, no SOB, no oral swelling.

## 2016-05-14 NOTE — ED Notes (Signed)
Pt speaks Congohinese, would like son to interpret

## 2016-05-14 NOTE — ED Provider Notes (Signed)
CSN: [id number]     Arrival date & time 05/14/16  1919 History   First MD Initiated Contact with Patient 05/14/16 2017     Chief Complaint  Patient presents with  . Insect Bite     (Consider location/radiation/quality/duration/timing/severity/associated sxs/prior Treatment) HPI  Past Medical History  Diagnosis Date  . Thyroid disease     lesion-benign  . Gallstones     diagnosed in [place]   Past Surgical History  Procedure Laterality Date  . Cesarean section    . Hemi thyroidectomy Right     benign   No family history on file. Social History  Substance Use Topics  . Smoking status: Never Smoker   . Smokeless tobacco: Never Used  . Alcohol Use: No   OB History    Gravida Para Term Preterm AB TAB SAB Ectopic Multiple Living   0 0 0 0 0 0 0 0       Review of Systems    Allergies  Review of patient's allergies indicates no known allergies.  Home Medications   Prior to Admission medications   Medication Sig Start Date End Date Taking? Authorizing Provider  diphenhydrAMINE (BENADRYL) 25 MG tablet Take 1 tablet (25 mg total) by mouth every 6 (six) hours. 05/14/16   Rolland PorterMark Scarlette Hogston, MD  hydrOXYzine (ATARAX/VISTARIL) 25 MG tablet Take 1 tablet (25 mg total) by mouth every 6 (six) hours. 08/10/15   Robyn M Hess, PA-C  ibuprofen (ADVIL,MOTRIN) 400 MG tablet Take 2 tablets (800 mg total) by mouth 3 (three) times daily. 05/14/16   Rolland PorterMark Marwah Disbro, MD  meloxicam (MOBIC) 15 MG tablet Take 1 tablet (15 mg total) by mouth daily. 03/17/16   Sherren MochaEva N Shaw, MD  triamcinolone cream (KENALOG) 0.1 % Apply 1 application topically 2 (two) times daily. 08/10/15   Robyn M Hess, PA-C   BP 118/73 mmHg  Pulse 76  Temp(Src) 98.6 F (37 C) (Oral)  Resp 20  Ht 5\' 3"  (1.6 m)  Wt 125 lb (56.7 kg)  BMI 22.15 kg/m2  SpO2 100%  LMP 05/01/2016 Physical Exam  ED Course  Procedures (including critical care time) Labs Review Labs Reviewed - No data to display  Imaging Review No results found. I have  personally reviewed and evaluated these images and lab results as part of my medical decision-making.   EKG Interpretation None      MDM   Final diagnoses:  Hymenoptera reaction, accidental or unintentional, initial encounter    Local reaction. Benadryl, Motrin, and ice. Recheck as needed.    Rolland PorterMark Annalisia Ingber, MD 05/14/16 2035

## 2016-05-14 NOTE — Discharge Instructions (Signed)
Bee, Wasp, or [employer], wasps, and hornets are part of a family of insects that can sting people. These stings can cause pain and inflammation, but they are usually not serious. However, some people may have an allergic reaction to a sting. This can cause the symptoms to be more severe.  SYMPTOMS  Common symptoms of this condition include:   A red lump in the skin that sometimes has a tiny hole in the center. In some cases, a stinger may be in the center of the wound.  Pain and itching at the sting site.  Redness and swelling around the sting site. If you have an allergic reaction (localized allergic reaction), the swelling and redness may spread out from the sting site. In some cases, this reaction can continue to develop over the next 12-36 hours. In rare cases, a person may have a severe allergic reaction (anaphylactic reaction) to a sting. Symptoms of an anaphylactic reaction may include:   Wheezing or difficulty breathing.  Raised, itchy, red patches on the skin.  Nausea or vomiting.  Abdominal cramping.  Diarrhea.  Chest pain.  Fainting.  Redness of the face (flushing). DIAGNOSIS  This condition is usually diagnosed based on symptoms, medical history, and a physical exam. TREATMENT  Most stings can be treated with:   Icing to reduce swelling.  Medicines (antihistamines) to treat itching or an allergic reaction.  Medicines to help reduce pain. These may be medicines that you take by mouth, or medicated creams or lotions that you apply to your skin. If you were stung by a bee, the stinger and a small sac of poison may be in the wound. This may be removed by brushing across it with a flat card, such as a credit card. Another method is to pinch the area and pull it out. These methods can help reduce the severity of the body's reaction to the sting.  HOME CARE INSTRUCTIONS   Wash the sting site daily with soap and water as told by your health care provider.  Apply  or take over-the-counter and prescription medicines only as told by your health care provider.  If directed, apply ice to the sting area.  Put ice in a plastic bag.  Place a towel between your skin and the bag.  Leave the ice on for 20 minutes, 2-3 times per day.  Do not scratch the sting area.  To lessen pain, try using a paste that is made of water and baking soda. Rub the paste on the sting area and leave it on for 5 minutes.  If you had a severe allergic reaction to a sting, you may need:  To wear a medical bracelet or necklace that lists the allergy.  To learn when and how to use an anaphylaxis kit or epinephrine injection. Your family members may also need to learn this.  To carry an anaphylaxis kit with you at all times. SEEK MEDICAL CARE IF:   Your symptoms do not get better in 2-3 days.  You have redness, swelling, or pain that spreads beyond the area of the sting.  You have a fever. SEEK IMMEDIATE MEDICAL CARE IF:  You have symptoms of a severe allergic reaction. These include:   Wheezing or difficulty breathing.  Chest pain.  Light-headedness or fainting.  Itchy, raised, red patches on the skin.  Nausea or vomiting.  Abdominal cramping.  Diarrhea.   This information is not intended to replace advice given to you by your health care provider.  Make sure you discuss any questions you have with your health care provider. °  °Document Released: 11/23/2005 Document Revised: 08/14/2015 Document Reviewed: 04/10/2015 °Elsevier Interactive Patient Education ©2016 Elsevier Inc. ° °

## 2017-03-05 ENCOUNTER — Other Ambulatory Visit: Payer: Self-pay | Admitting: Family Medicine

## 2017-03-05 DIAGNOSIS — Z1231 Encounter for screening mammogram for malignant neoplasm of breast: Secondary | ICD-10-CM

## 2017-04-05 ENCOUNTER — Ambulatory Visit
Admission: RE | Admit: 2017-04-05 | Discharge: 2017-04-05 | Disposition: A | Discharge status: Home / Self Care | Payer: BLUE CROSS/BLUE SHIELD | Source: Ambulatory Visit | Attending: Family Medicine | Admitting: Family Medicine

## 2017-04-05 DIAGNOSIS — Z1231 Encounter for screening mammogram for malignant neoplasm of breast: Secondary | ICD-10-CM

## 2017-04-06 ENCOUNTER — Other Ambulatory Visit: Payer: Self-pay | Admitting: Family Medicine

## 2017-04-06 DIAGNOSIS — R928 Other abnormal and inconclusive findings on diagnostic imaging of breast: Secondary | ICD-10-CM

## 2017-04-15 ENCOUNTER — Ambulatory Visit
Admission: RE | Admit: 2017-04-15 | Discharge: 2017-04-15 | Disposition: A | Discharge status: Home / Self Care | Payer: BLUE CROSS/BLUE SHIELD | Source: Ambulatory Visit | Attending: Family Medicine | Admitting: Family Medicine

## 2017-04-15 ENCOUNTER — Other Ambulatory Visit: Payer: Self-pay | Admitting: Family Medicine

## 2017-04-15 DIAGNOSIS — R928 Other abnormal and inconclusive findings on diagnostic imaging of breast: Secondary | ICD-10-CM

## 2017-04-15 DIAGNOSIS — N6001 Solitary cyst of right breast: Secondary | ICD-10-CM

## 2017-05-14 ENCOUNTER — Encounter: Payer: Self-pay | Admitting: Physician Assistant

## 2017-05-14 ENCOUNTER — Ambulatory Visit (INDEPENDENT_AMBULATORY_CARE_PROVIDER_SITE_OTHER): Payer: BLUE CROSS/BLUE SHIELD | Admitting: Physician Assistant

## 2017-05-14 VITALS — BP 103/60 | HR 59 | Temp 98.0°F | Resp 16 | Ht 63.39 in | Wt 120.2 lb

## 2017-05-14 DIAGNOSIS — E041 Nontoxic single thyroid nodule: Secondary | ICD-10-CM | POA: Insufficient documentation

## 2017-05-14 DIAGNOSIS — Z114 Encounter for screening for human immunodeficiency virus [HIV]: Secondary | ICD-10-CM | POA: Diagnosis not present

## 2017-05-14 DIAGNOSIS — Z8619 Personal history of other infectious and parasitic diseases: Secondary | ICD-10-CM | POA: Diagnosis not present

## 2017-05-14 DIAGNOSIS — Z130 Encounter for screening for diseases of the blood and blood-forming organs and certain disorders involving the immune mechanism: Secondary | ICD-10-CM

## 2017-05-14 DIAGNOSIS — E7800 Pure hypercholesterolemia, unspecified: Secondary | ICD-10-CM

## 2017-05-14 DIAGNOSIS — Z0000 Encounter for general adult medical examination without abnormal findings: Secondary | ICD-10-CM | POA: Diagnosis not present

## 2017-05-14 DIAGNOSIS — Z13228 Encounter for screening for other metabolic disorders: Secondary | ICD-10-CM | POA: Diagnosis not present

## 2017-05-14 DIAGNOSIS — N6009 Solitary cyst of unspecified breast: Secondary | ICD-10-CM | POA: Diagnosis not present

## 2017-05-14 DIAGNOSIS — R768 Other specified abnormal immunological findings in serum: Secondary | ICD-10-CM

## 2017-05-14 NOTE — Patient Instructions (Addendum)
   IF you received an x-ray today, you will receive an invoice from [facility]. Please contact [facility] at [phone] with questions or concerns regarding your invoice.   IF you received labwork today, you will receive an invoice from [facility]. Please contact [facility] at [phone] with questions or concerns regarding your invoice.   Our billing staff will not be able to assist you with questions regarding bills from these companies.  You will be contacted with the lab results as soon as they are available. The fastest way to get your results is to activate your My Chart account. Instructions are located on the last page of this paperwork. If you have not heard from us regarding the results in 2 weeks, please contact this office.     Health Maintenance, Female Adopting a healthy lifestyle and getting preventive care can go a long way to promote health and wellness. Talk with your health care provider about what schedule of regular examinations is right for you. This is a good chance for you to check in with your provider about disease prevention and staying healthy. In between checkups, there are plenty of things you can do on your own. Experts have done a lot of research about which lifestyle changes and preventive measures are most likely to keep you healthy. Ask your health care provider for more information. Weight and diet Eat a healthy diet  Be sure to include plenty of vegetables, fruits, low-fat dairy products, and lean protein.  Do not eat a lot of foods high in solid fats, added sugars, or salt.  Get regular exercise. This is one of the most important things you can do for your health. ? Most adults should exercise for at least 150 minutes each week. The exercise should increase your heart rate and make you sweat (moderate-intensity exercise). ? Most adults should also do strengthening exercises at least twice a week. This is in addition to the  moderate-intensity exercise.  Maintain a healthy weight  Body mass index (BMI) is a measurement that can be used to identify possible weight problems. It estimates body fat based on height and weight. Your health care provider can help determine your BMI and help you achieve or maintain a healthy weight.  For females 20 years of age and older: ? A BMI below 18.5 is considered underweight. ? A BMI of 18.5 to 24.9 is normal. ? A BMI of 25 to 29.9 is considered overweight. ? A BMI of 30 and above is considered obese.  Watch levels of cholesterol and blood lipids  You should start having your blood tested for lipids and cholesterol at 46 years of age, then have this test every 5 years.  You may need to have your cholesterol levels checked more often if: ? Your lipid or cholesterol levels are high. ? You are older than 46 years of age. ? You are at high risk for heart disease.  Cancer screening Lung Cancer  Lung cancer screening is recommended for adults 55-80 years old who are at high risk for lung cancer because of a history of smoking.  A yearly low-dose CT scan of the lungs is recommended for people who: ? Currently smoke. ? Have quit within the past 15 years. ? Have at least a 30-pack-year history of smoking. A pack year is smoking an average of one pack of cigarettes a day for 1 year.  Yearly screening should continue until it has been 15 years since you quit.  Yearly   screening should stop if you develop a health problem that would prevent you from having lung cancer treatment.  Breast Cancer  Practice breast self-awareness. This means understanding how your breasts normally appear and feel.  It also means doing regular breast self-exams. Let your health care provider know about any changes, no matter how small.  If you are in your 20s or 30s, you should have a clinical breast exam (CBE) by a health care provider every 1-3 years as part of a regular health exam.  If you  are 73 or older, have a CBE every year. Also consider having a breast X-ray (mammogram) every year.  If you have a family history of breast cancer, talk to your health care provider about genetic screening.  If you are at high risk for breast cancer, talk to your health care provider about having an MRI and a mammogram every year.  Breast cancer gene (BRCA) assessment is recommended for women who have family members with BRCA-related cancers. BRCA-related cancers include: ? Breast. ? Ovarian. ? Tubal. ? Peritoneal cancers.  Results of the assessment will determine the need for genetic counseling and BRCA1 and BRCA2 testing.  Cervical Cancer Your health care provider may recommend that you be screened regularly for cancer of the pelvic organs (ovaries, uterus, and vagina). This screening involves a pelvic examination, including checking for microscopic changes to the surface of your cervix (Pap test). You may be encouraged to have this screening done every 3 years, beginning at age 61.  For women ages 90-65, health care providers may recommend pelvic exams and Pap testing every 3 years, or they may recommend the Pap and pelvic exam, combined with testing for human papilloma virus (HPV), every 5 years. Some types of HPV increase your risk of cervical cancer. Testing for HPV may also be done on women of any age with unclear Pap test results.  Other health care providers may not recommend any screening for nonpregnant women who are considered low risk for pelvic cancer and who do not have symptoms. Ask your health care provider if a screening pelvic exam is right for you.  If you have had past treatment for cervical cancer or a condition that could lead to cancer, you need Pap tests and screening for cancer for at least 20 years after your treatment. If Pap tests have been discontinued, your risk factors (such as having a new sexual partner) need to be reassessed to determine if screening should  resume. Some women have medical problems that increase the chance of getting cervical cancer. In these cases, your health care provider may recommend more frequent screening and Pap tests.  Colorectal Cancer  This type of cancer can be detected and often prevented.  Routine colorectal cancer screening usually begins at 46 years of age and continues through 46 years of age.  Your health care provider may recommend screening at an earlier age if you have risk factors for colon cancer.  Your health care provider may also recommend using home test kits to check for hidden blood in the stool.  A small camera at the end of a tube can be used to examine your colon directly (sigmoidoscopy or colonoscopy). This is done to check for the earliest forms of colorectal cancer.  Routine screening usually begins at age 67.  Direct examination of the colon should be repeated every 5-10 years through 46 years of age. However, you may need to be screened more often if early forms of precancerous polyps  or small growths are found.  Skin Cancer  Check your skin from head to toe regularly.  Tell your health care provider about any new moles or changes in moles, especially if there is a change in a mole's shape or color.  Also tell your health care provider if you have a mole that is larger than the size of a pencil eraser.  Always use sunscreen. Apply sunscreen liberally and repeatedly throughout the day.  Protect yourself by wearing long sleeves, pants, a wide-brimmed hat, and sunglasses whenever you are outside.  Heart disease, diabetes, and high blood pressure  High blood pressure causes heart disease and increases the risk of stroke. High blood pressure is more likely to develop in: ? People who have blood pressure in the high end of the normal range (130-139/85-89 mm Hg). ? People who are overweight or obese. ? People who are African American.  If you are 18-39 years of age, have your blood  pressure checked every 3-5 years. If you are 40 years of age or older, have your blood pressure checked every year. You should have your blood pressure measured twice-once when you are at a hospital or clinic, and once when you are not at a hospital or clinic. Record the average of the two measurements. To check your blood pressure when you are not at a hospital or clinic, you can use: ? An automated blood pressure machine at a pharmacy. ? A home blood pressure monitor.  If you are between 55 years and 79 years old, ask your health care provider if you should take aspirin to prevent strokes.  Have regular diabetes screenings. This involves taking a blood sample to check your fasting blood sugar level. ? If you are at a normal weight and have a low risk for diabetes, have this test once every three years after 45 years of age. ? If you are overweight and have a high risk for diabetes, consider being tested at a younger age or more often. Preventing infection Hepatitis B  If you have a higher risk for hepatitis B, you should be screened for this virus. You are considered at high risk for hepatitis B if: ? You were born in a country where hepatitis B is common. Ask your health care provider which countries are considered high risk. ? Your parents were born in a high-risk country, and you have not been immunized against hepatitis B (hepatitis B vaccine). ? You have HIV or AIDS. ? You use needles to inject street drugs. ? You live with someone who has hepatitis B. ? You have had sex with someone who has hepatitis B. ? You get hemodialysis treatment. ? You take certain medicines for conditions, including cancer, organ transplantation, and autoimmune conditions.  Hepatitis C  Blood testing is recommended for: ? Everyone born from 1945 through 1965. ? Anyone with known risk factors for hepatitis C.  Sexually transmitted infections (STIs)  You should be screened for sexually transmitted  infections (STIs) including gonorrhea and chlamydia if: ? You are sexually active and are younger than 46 years of age. ? You are older than 46 years of age and your health care provider tells you that you are at risk for this type of infection. ? Your sexual activity has changed since you were last screened and you are at an increased risk for chlamydia or gonorrhea. Ask your health care provider if you are at risk.  If you do not have HIV, but are at risk,   it may be recommended that you take a prescription medicine daily to prevent HIV infection. This is called pre-exposure prophylaxis (PrEP). You are considered at risk if: ? You are sexually active and do not regularly use condoms or know the HIV status of your partner(s). ? You take drugs by injection. ? You are sexually active with a partner who has HIV.  Talk with your health care provider about whether you are at high risk of being infected with HIV. If you choose to begin PrEP, you should first be tested for HIV. You should then be tested every 3 months for as long as you are taking PrEP. Pregnancy  If you are premenopausal and you may become pregnant, ask your health care provider about preconception counseling.  If you may become pregnant, take 400 to 800 micrograms (mcg) of folic acid every day.  If you want to prevent pregnancy, talk to your health care provider about birth control (contraception). Osteoporosis and menopause  Osteoporosis is a disease in which the bones lose minerals and strength with aging. This can result in serious bone fractures. Your risk for osteoporosis can be identified using a bone density scan.  If you are 34 years of age or older, or if you are at risk for osteoporosis and fractures, ask your health care provider if you should be screened.  Ask your health care provider whether you should take a calcium or vitamin D supplement to lower your risk for osteoporosis.  Menopause may have certain physical  symptoms and risks.  Hormone replacement therapy may reduce some of these symptoms and risks. Talk to your health care provider about whether hormone replacement therapy is right for you. Follow these instructions at home:  Schedule regular health, dental, and eye exams.  Stay current with your immunizations.  Do not use any tobacco products including cigarettes, chewing tobacco, or electronic cigarettes.  If you are pregnant, do not drink alcohol.  If you are breastfeeding, limit how much and how often you drink alcohol.  Limit alcohol intake to no more than 1 drink per day for nonpregnant women. One drink equals 12 ounces of beer, 5 ounces of wine, or 1 ounces of hard liquor.  Do not use street drugs.  Do not share needles.  Ask your health care provider for help if you need support or information about quitting drugs.  Tell your health care provider if you often feel depressed.  Tell your health care provider if you have ever been abused or do not feel safe at home. This information is not intended to replace advice given to you by your health care provider. Make sure you discuss any questions you have with your health care provider. Document Released: 06/08/2011 Document Revised: 04/30/2016 Document Reviewed: 08/27/2015 Elsevier Interactive Patient Education  [employer].

## 2017-05-14 NOTE — Progress Notes (Signed)
Donna Stephens  MRN: [id number] DOB: [birth year]  PCP: Patient, No Pcp Per  Subjective:  Pt presents to clinic for a CPE. No concerns today other than making sure she gets her thyroid US ordered.  Last [place] was 5/17 where she did not need a biopsy at the time on the right thyroid nodule but she has had the left thyroid lobe removed in the remote past. She also would like to be checked for Hep B as she thinks that she has tested positive for that in the past and wants to know her status now.  Last pap: 12/14 - neg with neg HPV Last mammo: Last colonoscopy: Vaccinations: UTD       Used [facility] - [phone]  Patient Active Problem List   Diagnosis Date Noted  . Breast cyst 05/14/2017  . Thyroid nodule 05/14/2017  . Liver mass 11/16/2013  . Gallstones     Review of Systems  Constitutional: Negative.   HENT: Negative.   Eyes: Negative.   Respiratory: Negative.   Cardiovascular: Negative.   Gastrointestinal: Negative.   Endocrine: Negative.   Genitourinary: Negative.   Musculoskeletal: Negative.   Skin: Negative.   Allergic/Immunologic: Negative.   Neurological: Negative.   Hematological: Negative.   Psychiatric/Behavioral: Negative.      No current outpatient prescriptions on file prior to visit.   No current facility-administered medications on file prior to visit.     No Known Allergies  Social History   Social History  . Marital status: Married    Spouse name: N/A  . Number of children: 2  . Years of education: 9th grade   Occupational History  . office      takes phone orders   Social History Main Topics  . Smoking status: Never Smoker  . Smokeless tobacco: Never Used  . Alcohol use No  . Drug use: No  . Sexual activity: Yes    Partners: Male   Other Topics Concern  . None   Social History Narrative   Lives at home with husband and 2 sons.       From [place], came to the [place] in 2009.  One son was born there, one in the [place].             Past Surgical History:  Procedure Laterality Date  . CESAREAN SECTION    . Hemi thyroidectomy Right    benign    No family history on file.   Objective:  BP 103/60 (BP Location: Right Arm, Patient Position: Sitting, Cuff Size: Small)   Pulse (!) 59   Temp 98 F (36.7 C) (Oral)   Resp 16   Ht 5' 3.39" (1.61 m)   Wt 120 lb 3.2 oz (54.5 kg)   LMP 05/04/2017   SpO2 100%   BMI 21.03 kg/m   Physical Exam  Constitutional: She is oriented to person, place, and time and well-developed, well-nourished, and in no distress.  HENT:  Head: Normocephalic and atraumatic.  Right Ear: Hearing, tympanic membrane, external ear and ear canal normal.  Left Ear: Hearing, tympanic membrane, external ear and ear canal normal.  Nose: Nose normal.  Mouth/Throat: Uvula is midline, oropharynx is clear and moist and mucous membranes are normal.  Eyes: Conjunctivae and EOM are normal. Pupils are equal, round, and reactive to light.  Neck: Trachea normal and normal range of motion. Neck supple. No thyroid mass and no thyromegaly present.  Cardiovascular: Normal rate, regular rhythm and normal heart sounds.   No  murmur heard. Pulmonary/Chest: Effort normal and breath sounds normal. She has no wheezes.  Abdominal: Soft. Bowel sounds are normal. There is no tenderness.  Musculoskeletal: Normal range of motion.  Lymphadenopathy:    She has no cervical adenopathy.  Neurological: She is alert and oriented to person, place, and time. She has normal motor skills, normal sensation, normal strength and normal reflexes. Gait normal.  Skin: Skin is warm and dry.  Psychiatric: Mood, memory, affect and judgment normal.    Wt Readings from Last 3 Encounters:  05/14/17 120 lb 3.2 oz (54.5 kg)  05/14/16 125 lb (56.7 kg)  03/17/16 125 lb 6.4 oz (56.9 kg)     Visual Acuity Screening   Right eye Left eye Both eyes  Without correction: '20/20 20/20 20/15 '  With correction:       Assessment and Plan :   Annual physical exam - Plan: Care order/instruction:  Cyst of breast, unspecified laterality - f/u as planned with breast center  Thyroid nodule - Plan: TSH, T4, Free, US THYROID - check labs and repeat US to see if there is any change  Screening for deficiency anemia - Plan: CBC with Differential/Platelet  Screening for HIV (human immunodeficiency virus) - Plan: HIV antibody  Elevated cholesterol - Plan: Lipid panel  Screening for metabolic disorder  History of hepatitis B - Plan: CMP14+EGFR, Hepatitis B surface antigen  Windell Hummingbird PA-C  [facility] 05/17/2017 8:52 AM

## 2017-05-15 LAB — CBC WITH DIFFERENTIAL/PLATELET
BASOS: 0 %
Basophils Absolute: 0 10*3/uL (ref 0.0–0.2)
EOS (ABSOLUTE): 0.1 10*3/uL (ref 0.0–0.4)
Eos: 2 %
HEMOGLOBIN: 13.5 g/dL (ref 11.1–15.9)
Hematocrit: 41.7 % (ref 34.0–46.6)
IMMATURE GRANS (ABS): 0 10*3/uL (ref 0.0–0.1)
Immature Granulocytes: 0 %
LYMPHS: 40 %
Lymphocytes Absolute: 1.3 10*3/uL (ref 0.7–3.1)
MCH: 29 pg (ref 26.6–33.0)
MCHC: 32.4 g/dL (ref 31.5–35.7)
MCV: 90 fL (ref 79–97)
MONOCYTES: 10 %
Monocytes Absolute: 0.3 10*3/uL (ref 0.1–0.9)
NEUTROS ABS: 1.6 10*3/uL (ref 1.4–7.0)
Neutrophils: 48 %
Platelets: 221 10*3/uL (ref 150–379)
RBC: 4.65 x10E6/uL (ref 3.77–5.28)
RDW: 13.5 % (ref 12.3–15.4)
WBC: 3.3 10*3/uL — ABNORMAL LOW (ref 3.4–10.8)

## 2017-05-15 LAB — CMP14+EGFR
A/G RATIO: 1.5 (ref 1.2–2.2)
ALT: 15 IU/L (ref 0–32)
AST: 16 IU/L (ref 0–40)
Albumin: 4.4 g/dL (ref 3.5–5.5)
Alkaline Phosphatase: 36 IU/L — ABNORMAL LOW (ref 39–117)
BUN/Creatinine Ratio: 14 (ref 9–23)
BUN: 9 mg/dL (ref 6–24)
Bilirubin Total: 0.8 mg/dL (ref 0.0–1.2)
CALCIUM: 9.1 mg/dL (ref 8.7–10.2)
CO2: 23 mmol/L (ref 18–29)
Chloride: 103 mmol/L (ref 96–106)
Creatinine, Ser: 0.64 mg/dL (ref 0.57–1.00)
GFR, EST AFRICAN AMERICAN: 125 mL/min/{1.73_m2} (ref >59)
GFR, EST NON AFRICAN AMERICAN: 108 mL/min/{1.73_m2} (ref >59)
Globulin, Total: 3 g/dL (ref 1.5–4.5)
Glucose: 87 mg/dL (ref 65–99)
POTASSIUM: 4.1 mmol/L (ref 3.5–5.2)
Sodium: 139 mmol/L (ref 134–144)
TOTAL PROTEIN: 7.4 g/dL (ref 6.0–8.5)

## 2017-05-15 LAB — LIPID PANEL
CHOL/HDL RATIO: 2.9 ratio (ref 0.0–4.4)
Cholesterol, Total: 165 mg/dL (ref 100–199)
HDL: 57 mg/dL (ref >39)
LDL CALC: 99 mg/dL (ref 0–99)
Triglycerides: 45 mg/dL (ref 0–149)
VLDL CHOLESTEROL CAL: 9 mg/dL (ref 5–40)

## 2017-05-15 LAB — HIV ANTIBODY (ROUTINE TESTING W REFLEX): HIV Screen 4th Generation wRfx: NONREACTIVE

## 2017-05-15 LAB — HEPATITIS B SURFACE ANTIGEN

## 2017-05-15 LAB — TSH: TSH: 2.53 u[IU]/mL (ref 0.450–4.500)

## 2017-05-15 LAB — T4, FREE: FREE T4: 1.42 ng/dL (ref 0.82–1.77)

## 2017-05-15 LAB — HEPATITIS B SURFACE AG, CONFIRM: HBsAg Confirmation: POSITIVE — AB

## 2017-05-21 NOTE — Addendum Note (Signed)
Addended by: Morrell RiddleWEBER, SARAH L on: 05/21/2017 05:51 PM   Modules accepted: Orders

## 2017-05-22 LAB — HEPATITIS B E ANTIGEN: HEP B E AG: NEGATIVE

## 2017-05-22 LAB — HEPATITIS B DNA, ULTRAQUANTITATIVE, PCR
HBV DNA SERPL PCR-ACNC: 5920 IU/mL
HBV DNA SERPL PCR-LOG IU: 3.772 log10 IU/mL

## 2017-05-28 ENCOUNTER — Ambulatory Visit
Admission: RE | Admit: 2017-05-28 | Discharge: 2017-05-28 | Disposition: A | Discharge status: Home / Self Care | Payer: BLUE CROSS/BLUE SHIELD | Source: Ambulatory Visit | Attending: Physician Assistant | Admitting: Physician Assistant

## 2017-05-28 ENCOUNTER — Other Ambulatory Visit: Payer: Self-pay

## 2017-05-28 DIAGNOSIS — E041 Nontoxic single thyroid nodule: Secondary | ICD-10-CM

## 2017-06-01 ENCOUNTER — Encounter: Payer: Self-pay | Admitting: Emergency Medicine

## 2017-10-18 ENCOUNTER — Other Ambulatory Visit: Payer: Self-pay | Admitting: Family Medicine

## 2017-10-18 ENCOUNTER — Ambulatory Visit
Admission: RE | Admit: 2017-10-18 | Discharge: 2017-10-18 | Disposition: A | Discharge status: Home / Self Care | Payer: BLUE CROSS/BLUE SHIELD | Source: Ambulatory Visit | Attending: Family Medicine | Admitting: Family Medicine

## 2017-10-18 DIAGNOSIS — N6001 Solitary cyst of right breast: Secondary | ICD-10-CM

## 2017-10-18 DIAGNOSIS — N6310 Unspecified lump in the right breast, unspecified quadrant: Secondary | ICD-10-CM

## 2018-04-18 ENCOUNTER — Ambulatory Visit
Admission: RE | Admit: 2018-04-18 | Discharge: 2018-04-18 | Disposition: A | Discharge status: Home / Self Care | Payer: BLUE CROSS/BLUE SHIELD | Source: Ambulatory Visit | Attending: Family Medicine | Admitting: Family Medicine

## 2018-04-18 DIAGNOSIS — N6310 Unspecified lump in the right breast, unspecified quadrant: Secondary | ICD-10-CM

## 2018-06-23 ENCOUNTER — Encounter: Payer: Self-pay | Admitting: Physician Assistant

## 2018-06-23 ENCOUNTER — Other Ambulatory Visit: Payer: Self-pay

## 2018-06-23 ENCOUNTER — Ambulatory Visit (INDEPENDENT_AMBULATORY_CARE_PROVIDER_SITE_OTHER): Payer: BLUE CROSS/BLUE SHIELD | Admitting: Physician Assistant

## 2018-06-23 VITALS — BP 100/62 | HR 70 | Temp 99.0°F | Resp 18 | Ht 64.0 in | Wt 120.0 lb

## 2018-06-23 DIAGNOSIS — Z0000 Encounter for general adult medical examination without abnormal findings: Secondary | ICD-10-CM

## 2018-06-23 DIAGNOSIS — E041 Nontoxic single thyroid nodule: Secondary | ICD-10-CM

## 2018-06-23 DIAGNOSIS — Z130 Encounter for screening for diseases of the blood and blood-forming organs and certain disorders involving the immune mechanism: Secondary | ICD-10-CM

## 2018-06-23 DIAGNOSIS — E7800 Pure hypercholesterolemia, unspecified: Secondary | ICD-10-CM

## 2018-06-23 DIAGNOSIS — Z13228 Encounter for screening for other metabolic disorders: Secondary | ICD-10-CM

## 2018-06-23 NOTE — Patient Instructions (Addendum)
I will contact you with your lab results as soon as they are available.   If you have not heard from me in 2 weeks, please contact me.  The fastest way to get your results is to register for My Chart (see the instructions on this printout).     IF you received an x-ray today, you will receive an invoice from [facility]. Please contact [facility] at [phone] with questions or concerns regarding your invoice.   IF you received labwork today, you will receive an invoice from [facility]. Please contact [facility] at [phone] with questions or concerns regarding your invoice.   Our billing staff will not be able to assist you with questions regarding bills from these companies.  You will be contacted with the lab results as soon as they are available. The fastest way to get your results is to activate your My Chart account. Instructions are located on the last page of this paperwork. If you have not heard from us regarding the results in 2 weeks, please contact this office.    Health Maintenance, Female Adopting a healthy lifestyle and getting preventive care can go a long way to promote health and wellness. Talk with your health care provider about what schedule of regular examinations is right for you. This is a good chance for you to check in with your provider about disease prevention and staying healthy. In between checkups, there are plenty of things you can do on your own. Experts have done a lot of research about which lifestyle changes and preventive measures are most likely to keep you healthy. Ask your health care provider for more information. Weight and diet Eat a healthy diet  Be sure to include plenty of vegetables, fruits, low-fat dairy products, and lean protein.  Do not eat a lot of foods high in solid fats, added sugars, or salt.  Get regular exercise. This is one of the most important things you can do for your health. ? Most adults should exercise  for at least 150 minutes each week. The exercise should increase your heart rate and make you sweat (moderate-intensity exercise). ? Most adults should also do strengthening exercises at least twice a week. This is in addition to the moderate-intensity exercise.  Maintain a healthy weight  Body mass index (BMI) is a measurement that can be used to identify possible weight problems. It estimates body fat based on height and weight. Your health care provider can help determine your BMI and help you achieve or maintain a healthy weight.  For females 20 years of age and older: ? A BMI below 18.5 is considered underweight. ? A BMI of 18.5 to 24.9 is normal. ? A BMI of 25 to 29.9 is considered overweight. ? A BMI of 30 and above is considered obese.  Watch levels of cholesterol and blood lipids  You should start having your blood tested for lipids and cholesterol at 47 years of age, then have this test every 5 years.  You may need to have your cholesterol levels checked more often if: ? Your lipid or cholesterol levels are high. ? You are older than 47 years of age. ? You are at high risk for heart disease.  Cancer screening Lung Cancer  Lung cancer screening is recommended for adults 55-80 years old who are at high risk for lung cancer because of a history of smoking.  A yearly low-dose CT scan of the lungs is recommended for people who: ? Currently smoke. ?   within the past 15 years. ? Have at least a 30-pack-year history of smoking. A pack year is smoking an average of one pack of cigarettes a day for 1 year.  Yearly screening should continue until it has been 15 years since you quit.  Yearly screening should stop if you develop a health problem that would prevent you from having lung cancer treatment.  Breast Cancer  Practice breast self-awareness. This means understanding how your breasts normally appear and feel.  It also means doing regular breast self-exams. Let your  health care provider know about any changes, no matter how small.  If you are in your 20s or 30s, you should have a clinical breast exam (CBE) by a health care provider every 1-3 years as part of a regular health exam.  If you are 23 or older, have a CBE every year. Also consider having a breast X-ray (mammogram) every year.  If you have a family history of breast cancer, talk to your health care provider about genetic screening.  If you are at high risk for breast cancer, talk to your health care provider about having an MRI and a mammogram every year.  Breast cancer gene (BRCA) assessment is recommended for women who have family members with BRCA-related cancers. BRCA-related cancers include: ? Breast. ? Ovarian. ? Tubal. ? Peritoneal cancers.  Results of the assessment will determine the need for genetic counseling and BRCA1 and BRCA2 testing.  Cervical Cancer Your health care provider may recommend that you be screened regularly for cancer of the pelvic organs (ovaries, uterus, and vagina). This screening involves a pelvic examination, including checking for microscopic changes to the surface of your cervix (Pap test). You may be encouraged to have this screening done every 3 years, beginning at age 60.  For women ages 18-65, health care providers may recommend pelvic exams and Pap testing every 3 years, or they may recommend the Pap and pelvic exam, combined with testing for human papilloma virus (HPV), every 5 years. Some types of HPV increase your risk of cervical cancer. Testing for HPV may also be done on women of any age with unclear Pap test results.  Other health care providers may not recommend any screening for nonpregnant women who are considered low risk for pelvic cancer and who do not have symptoms. Ask your health care provider if a screening pelvic exam is right for you.  If you have had past treatment for cervical cancer or a condition that could lead to cancer, you need  Pap tests and screening for cancer for at least 20 years after your treatment. If Pap tests have been discontinued, your risk factors (such as having a new sexual partner) need to be reassessed to determine if screening should resume. Some women have medical problems that increase the chance of getting cervical cancer. In these cases, your health care provider may recommend more frequent screening and Pap tests.  Colorectal Cancer  This type of cancer can be detected and often prevented.  Routine colorectal cancer screening usually begins at 47 years of age and continues through 47 years of age.  Your health care provider may recommend screening at an earlier age if you have risk factors for colon cancer.  Your health care provider may also recommend using home test kits to check for hidden blood in the stool.  A small camera at the end of a tube can be used to examine your colon directly (sigmoidoscopy or colonoscopy). This is done to check  for the earliest forms of colorectal cancer.  Routine screening usually begins at age 75.  Direct examination of the colon should be repeated every 5-10 years through 47 years of age. However, you may need to be screened more often if early forms of precancerous polyps or small growths are found.  Skin Cancer  Check your skin from head to toe regularly.  Tell your health care provider about any new moles or changes in moles, especially if there is a change in a mole's shape or color.  Also tell your health care provider if you have a mole that is larger than the size of a pencil eraser.  Always use sunscreen. Apply sunscreen liberally and repeatedly throughout the day.  Protect yourself by wearing long sleeves, pants, a wide-brimmed hat, and sunglasses whenever you are outside.  Heart disease, diabetes, and high blood pressure  High blood pressure causes heart disease and increases the risk of stroke. High blood pressure is more likely to develop  in: ? People who have blood pressure in the high end of the normal range (130-139/85-89 mm Hg). ? People who are overweight or obese. ? People who are African American.  If you are 41-67 years of age, have your blood pressure checked every 3-5 years. If you are 32 years of age or older, have your blood pressure checked every year. You should have your blood pressure measured twice-once when you are at a hospital or clinic, and once when you are not at a hospital or clinic. Record the average of the two measurements. To check your blood pressure when you are not at a hospital or clinic, you can use: ? An automated blood pressure machine at a pharmacy. ? A home blood pressure monitor.  If you are between 44 years and 70 years old, ask your health care provider if you should take aspirin to prevent strokes.  Have regular diabetes screenings. This involves taking a blood sample to check your fasting blood sugar level. ? If you are at a normal weight and have a low risk for diabetes, have this test once every three years after 47 years of age. ? If you are overweight and have a high risk for diabetes, consider being tested at a younger age or more often. Preventing infection Hepatitis B  If you have a higher risk for hepatitis B, you should be screened for this virus. You are considered at high risk for hepatitis B if: ? You were born in a country where hepatitis B is common. Ask your health care provider which countries are considered high risk. ? Your parents were born in a high-risk country, and you have not been immunized against hepatitis B (hepatitis B vaccine). ? You have HIV or AIDS. ? You use needles to inject street drugs. ? You live with someone who has hepatitis B. ? You have had sex with someone who has hepatitis B. ? You get hemodialysis treatment. ? You take certain medicines for conditions, including cancer, organ transplantation, and autoimmune conditions.  Hepatitis C  Blood  testing is recommended for: ? Everyone born from [birth year] through 1965. ? Anyone with known risk factors for hepatitis C.  Sexually transmitted infections (STIs)  You should be screened for sexually transmitted infections (STIs) including gonorrhea and chlamydia if: ? You are sexually active and are younger than 47 years of age. ? You are older than 47 years of age and your health care provider tells you that you are at risk for  this type of infection. ? Your sexual activity has changed since you were last screened and you are at an increased risk for chlamydia or gonorrhea. Ask your health care provider if you are at risk.  If you do not have HIV, but are at risk, it may be recommended that you take a prescription medicine daily to prevent HIV infection. This is called pre-exposure prophylaxis (PrEP). You are considered at risk if: ? You are sexually active and do not regularly use condoms or know the HIV status of your partner(s). ? You take drugs by injection. ? You are sexually active with a partner who has HIV.  Talk with your health care provider about whether you are at high risk of being infected with HIV. If you choose to begin PrEP, you should first be tested for HIV. You should then be tested every 3 months for as long as you are taking PrEP. Pregnancy  If you are premenopausal and you may become pregnant, ask your health care provider about preconception counseling.  If you may become pregnant, take 400 to 800 micrograms (mcg) of folic acid every day.  If you want to prevent pregnancy, talk to your health care provider about birth control (contraception). Osteoporosis and menopause  Osteoporosis is a disease in which the bones lose minerals and strength with aging. This can result in serious bone fractures. Your risk for osteoporosis can be identified using a bone density scan.  If you are 65 years of age or older, or if you are at risk for osteoporosis and fractures, ask your  health care provider if you should be screened.  Ask your health care provider whether you should take a calcium or vitamin D supplement to lower your risk for osteoporosis.  Menopause may have certain physical symptoms and risks.  Hormone replacement therapy may reduce some of these symptoms and risks. Talk to your health care provider about whether hormone replacement therapy is right for you. Follow these instructions at home:  Schedule regular health, dental, and eye exams.  Stay current with your immunizations.  Do not use any tobacco products including cigarettes, chewing tobacco, or electronic cigarettes.  If you are pregnant, do not drink alcohol.  If you are breastfeeding, limit how much and how often you drink alcohol.  Limit alcohol intake to no more than 1 drink per day for nonpregnant women. One drink equals 12 ounces of beer, 5 ounces of wine, or 1 ounces of hard liquor.  Do not use street drugs.  Do not share needles.  Ask your health care provider for help if you need support or information about quitting drugs.  Tell your health care provider if you often feel depressed.  Tell your health care provider if you have ever been abused or do not feel safe at home. This information is not intended to replace advice given to you by your health care provider. Make sure you discuss any questions you have with your health care provider. Document Released: 06/08/2011 Document Revised: 04/30/2016 Document Reviewed: 08/27/2015 Elsevier Interactive Patient Education  2018 Elsevier Inc.  

## 2018-06-23 NOTE — Progress Notes (Signed)
Donna Stephens  MRN: [id number] DOB: [birth year]  PCP: Mancel Bale, PA-C   Chief Complaint  Patient presents with  . Annual Exam    Subjective:  Pt presents to clinic for a CPE.  Last dental exam: no dental visits Last vision exam: no visits Last pap: 2014 - normal with neg HPV Last mammo: 04/2018  Vaccinations - UTD  Typical meals for patient: 3 meals and a snack - homecooked meals Typical beverage choices: water Exercises: occ walks Sleeps: sleep at 11 and wakes at 9 - hard to fall asleep but once she falls asleep she sleeps well  Patient Active Problem List   Diagnosis Date Noted  . Breast cyst 05/14/2017  . Thyroid nodule 05/14/2017  . Liver mass 11/16/2013  . Gallstones     Patient Care Team: Mittie Bodo as PCP - General (Physician Assistant)  Review of Systems  Constitutional: Negative.   HENT: Negative.   Eyes: Negative.   Respiratory: Negative.   Cardiovascular: Negative.   Gastrointestinal: Negative.   Endocrine: Negative.   Genitourinary: Negative.        Vaginal irritation last month but it has resolved and she is having nothing currently.  Musculoskeletal: Negative.   Skin: Negative.   Allergic/Immunologic: Negative.   Neurological: Negative.   Hematological: Negative.   Psychiatric/Behavioral: Negative.      No current outpatient medications on file prior to visit.   No current facility-administered medications on file prior to visit.     No Known Allergies  Social History   Socioeconomic History  . Marital status: Married    Spouse name: Not on file  . Number of children: 2  . Years of education: 9th grade  . Highest education level: Not on file  Occupational History  . Occupation: office     Comment: takes phone orders  Social Needs  . Financial resource strain: Not on file  . Food insecurity:    Worry: Not on file    Inability: Not on file  . Transportation needs:    Medical: Not on file    Non-medical: Not on  file  Tobacco Use  . Smoking status: Never Smoker  . Smokeless tobacco: Never Used  Substance and Sexual Activity  . Alcohol use: No  . Drug use: No  . Sexual activity: Yes    Partners: Male  Lifestyle  . Physical activity:    Days per week: Not on file    Minutes per session: Not on file  . Stress: Not on file  Relationships  . Social connections:    Talks on phone: Not on file    Gets together: Not on file    Attends religious service: Not on file    Active member of club or organization: Not on file    Attends meetings of clubs or organizations: Not on file    Relationship status: Not on file  Other Topics Concern  . Not on file  Social History Narrative   Lives at home with husband and 2 sons.       From [place], came to the [place] in 2009.  One son was born there, one in the [place].         Past Surgical History:  Procedure Laterality Date  . CESAREAN SECTION    . Hemi thyroidectomy Right    benign    Family History  Problem Relation Age of Onset  . Thyroid cancer Sister      Objective:  BP 100/62   Pulse 70   Temp 99 F (37.2 C) (Oral)   Resp 18   Ht _0  (1.626 m)   Wt 120 lb (54.4 kg)   LMP 06/08/2018   SpO2 99%   BMI 20.60 kg/m   Physical Exam  Constitutional: She is oriented to person, place, and time. She appears well-developed and well-nourished.  HENT:  Head: Normocephalic and atraumatic.  Right Ear: Hearing, tympanic membrane, external ear and ear canal normal.  Left Ear: Hearing, tympanic membrane, external ear and ear canal normal.  Nose: Nose normal.  Mouth/Throat: Uvula is midline, oropharynx is clear and moist and mucous membranes are normal.  Eyes: Pupils are equal, round, and reactive to light. Conjunctivae, EOM and lids are normal. Right eye exhibits no discharge. Left eye exhibits no discharge.  Neck: Trachea normal and normal range of motion. Neck supple. No thyroid mass and no thyromegaly present.  Cardiovascular: Normal rate, regular  rhythm and normal heart sounds.  No murmur heard. Pulmonary/Chest: Effort normal and breath sounds normal. She has no wheezes. Right breast exhibits no inverted nipple, no mass, no nipple discharge, no skin change and no tenderness. Left breast exhibits no inverted nipple, no mass, no nipple discharge, no skin change and no tenderness.  Abdominal: Soft. Normal appearance and bowel sounds are normal. There is no tenderness.  Musculoskeletal: Normal range of motion.  Lymphadenopathy:       Head (right side): No tonsillar, no preauricular, no posterior auricular and no occipital adenopathy present.       Head (left side): No tonsillar, no preauricular, no posterior auricular and no occipital adenopathy present.    She has no cervical adenopathy.       Right: No supraclavicular adenopathy present.       Left: No supraclavicular adenopathy present.  Neurological: She is alert and oriented to person, place, and time. She has normal strength and normal reflexes.  Skin: Skin is warm, dry and intact.  Psychiatric: She has a normal mood and affect. Her speech is normal and behavior is normal. Judgment and thought content normal.  Vitals reviewed.   Wt Readings from Last 3 Encounters:  06/23/18 120 lb (54.4 kg)  05/14/17 120 lb 3.2 oz (54.5 kg)  05/14/16 125 lb (56.7 kg)     Hearing Screening   _1  _2  _3  _4  _5  _6  _7  _8  _9   Right ear: 15          Left ear:             Visual Acuity Screening   Right eye Left eye Both eyes  Without correction: _10  With correction:       Assessment and Plan :  Annual physical exam - anticipatory guidance  Thyroid nodule - Plan: US THYROID, TSH - check labs and [place] - nodule was stable last year  Screening for deficiency anemia - Plan: CBC with Differential/Platelet  Elevated cholesterol - Plan: Lipid panel - check labs  Screening for metabolic disorder - Plan: CMP14+EGFR  Windell Hummingbird PA-C  [facility] 06/23/2018 12:39 PM  Please note: Portions of this report may have been transcribed using dragon voice recognition software. Every effort was made to ensure accuracy; however, inadvertent computerized transcription errors may be present.

## 2018-06-24 LAB — CBC WITH DIFFERENTIAL/PLATELET
Basophils Absolute: 0 10*3/uL (ref 0.0–0.2)
Basos: 1 %
EOS (ABSOLUTE): 0.1 10*3/uL (ref 0.0–0.4)
EOS: 2 %
Hematocrit: 42.4 % (ref 34.0–46.6)
Hemoglobin: 14.1 g/dL (ref 11.1–15.9)
IMMATURE GRANULOCYTES: 0 %
Immature Grans (Abs): 0 10*3/uL (ref 0.0–0.1)
LYMPHS ABS: 1.4 10*3/uL (ref 0.7–3.1)
Lymphs: 31 %
MCH: 29.7 pg (ref 26.6–33.0)
MCHC: 33.3 g/dL (ref 31.5–35.7)
MCV: 90 fL (ref 79–97)
MONOS ABS: 0.4 10*3/uL (ref 0.1–0.9)
Monocytes: 9 %
Neutrophils Absolute: 2.6 10*3/uL (ref 1.4–7.0)
Neutrophils: 57 %
PLATELETS: 225 10*3/uL (ref 150–450)
RBC: 4.74 x10E6/uL (ref 3.77–5.28)
RDW: 13.2 % (ref 12.3–15.4)
WBC: 4.5 10*3/uL (ref 3.4–10.8)

## 2018-06-24 LAB — CMP14+EGFR
ALK PHOS: 37 IU/L — AB (ref 39–117)
ALT: 17 IU/L (ref 0–32)
AST: 18 IU/L (ref 0–40)
Albumin/Globulin Ratio: 1.6 (ref 1.2–2.2)
Albumin: 4.4 g/dL (ref 3.5–5.5)
BUN/Creatinine Ratio: 16 (ref 9–23)
BUN: 11 mg/dL (ref 6–24)
Bilirubin Total: 0.5 mg/dL (ref 0.0–1.2)
CALCIUM: 9 mg/dL (ref 8.7–10.2)
CHLORIDE: 105 mmol/L (ref 96–106)
CO2: 26 mmol/L (ref 20–29)
Creatinine, Ser: 0.68 mg/dL (ref 0.57–1.00)
GFR calc Af Amer: 121 mL/min/{1.73_m2} (ref >59)
GFR calc non Af Amer: 105 mL/min/{1.73_m2} (ref >59)
Globulin, Total: 2.7 g/dL (ref 1.5–4.5)
Glucose: 88 mg/dL (ref 65–99)
Potassium: 4.7 mmol/L (ref 3.5–5.2)
SODIUM: 143 mmol/L (ref 134–144)
Total Protein: 7.1 g/dL (ref 6.0–8.5)

## 2018-06-24 LAB — LIPID PANEL
CHOLESTEROL TOTAL: 168 mg/dL (ref 100–199)
Chol/HDL Ratio: 3.1 ratio (ref 0.0–4.4)
HDL: 55 mg/dL (ref >39)
LDL Calculated: 100 mg/dL — ABNORMAL HIGH (ref 0–99)
Triglycerides: 63 mg/dL (ref 0–149)
VLDL Cholesterol Cal: 13 mg/dL (ref 5–40)

## 2018-06-24 LAB — TSH: TSH: 1.97 u[IU]/mL (ref 0.450–4.500)

## 2018-06-28 ENCOUNTER — Encounter: Payer: Self-pay | Admitting: Radiology

## 2018-07-08 ENCOUNTER — Ambulatory Visit
Admission: RE | Admit: 2018-07-08 | Discharge: 2018-07-08 | Disposition: A | Discharge status: Home / Self Care | Payer: BLUE CROSS/BLUE SHIELD | Source: Ambulatory Visit | Attending: Physician Assistant | Admitting: Physician Assistant

## 2018-07-08 DIAGNOSIS — E041 Nontoxic single thyroid nodule: Secondary | ICD-10-CM

## 2019-08-28 IMAGING — US US THYROID
1 series · 13 of 25 positions shown · non-contrast
Comparison: None.

CLINICAL DATA: 46-year-old female with a history of prior right
hemithyroidectomy

EXAM:
THYROID ULTRASOUND
TECHNIQUE: Ultrasound examination of the thyroid gland and adjacent soft
tissues was performed.

[Series 1: us thyroid · 0.05mm/px · 13 of 41 slices shown]
[im 1/41]
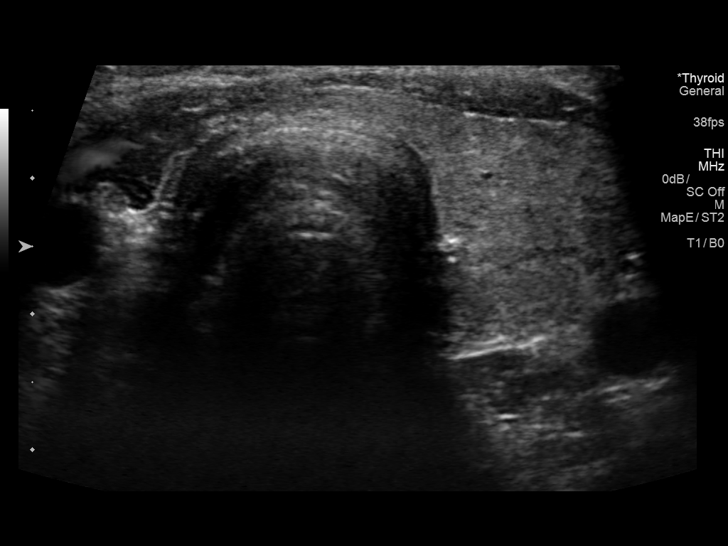
[im 4/41]
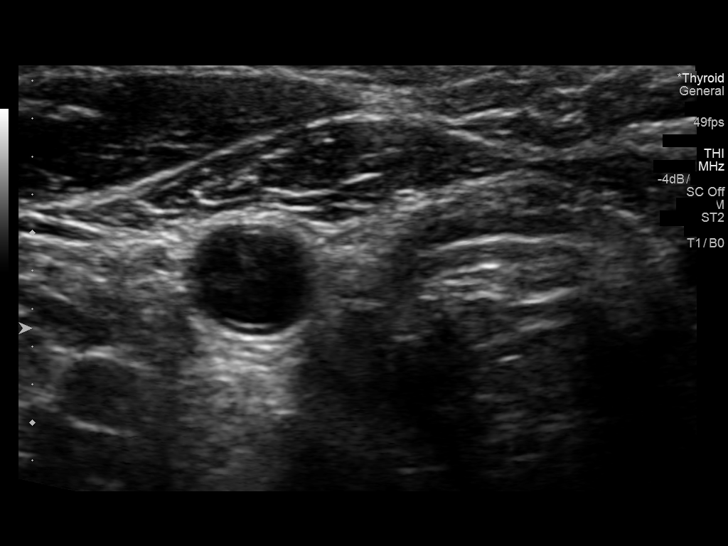
[im 7/41]
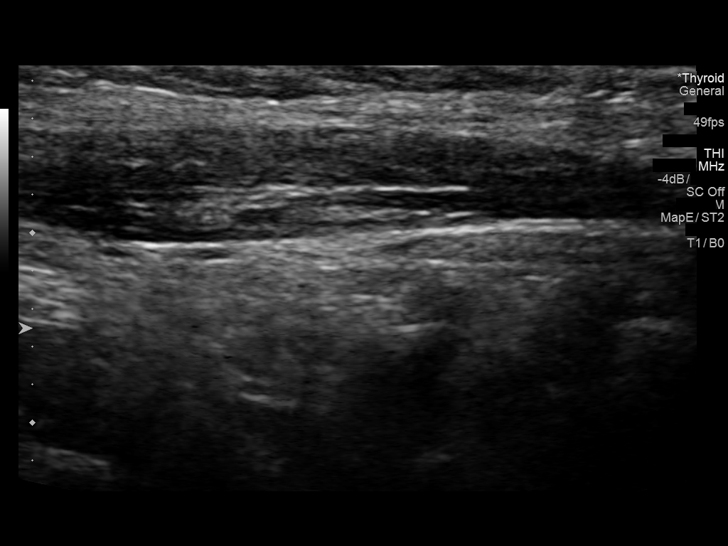
[im 11/41]
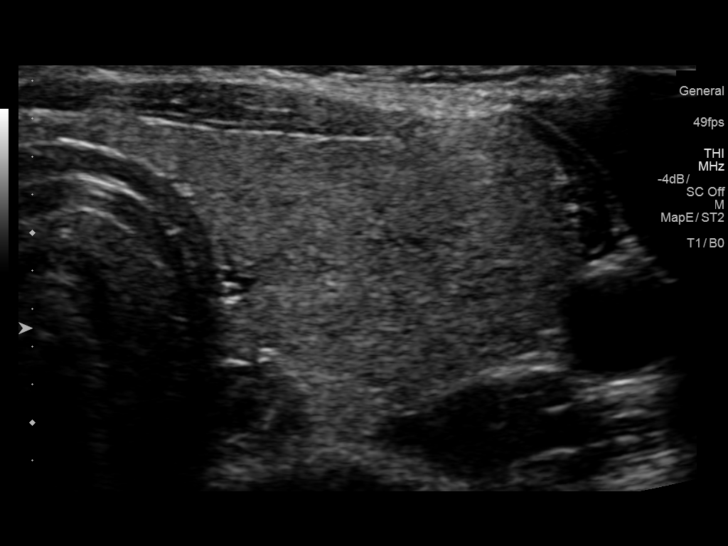
[im 14/41]
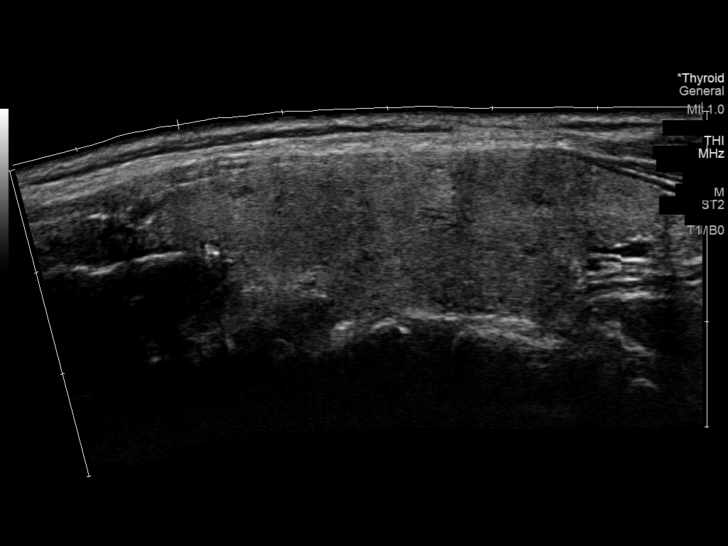
[im 17/41]
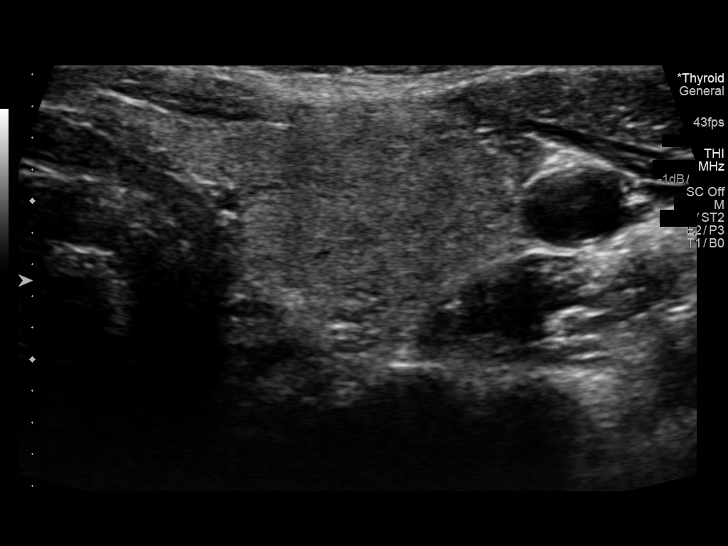
[im 21/41]
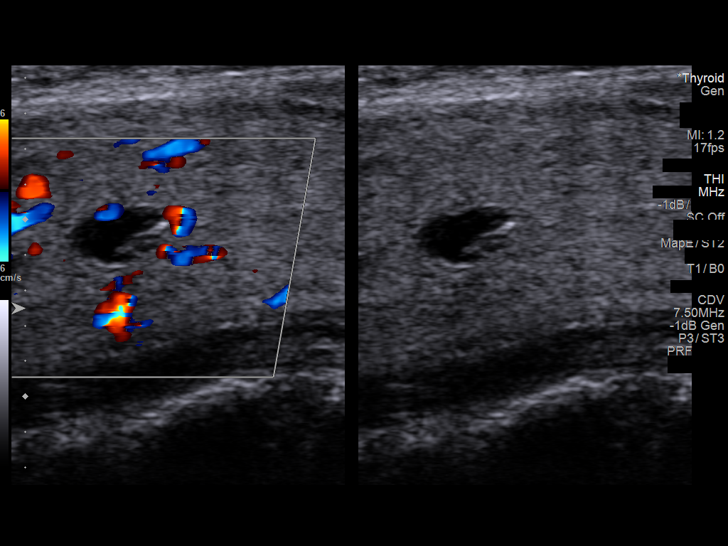
[im 24/41]
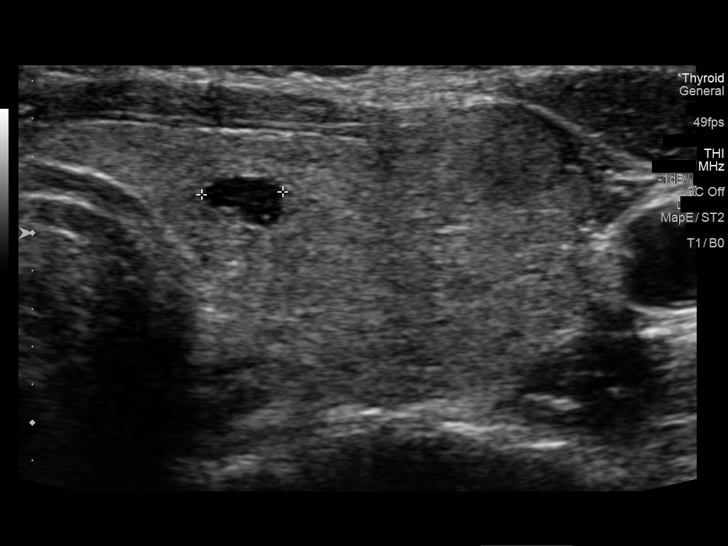
[im 27/41]
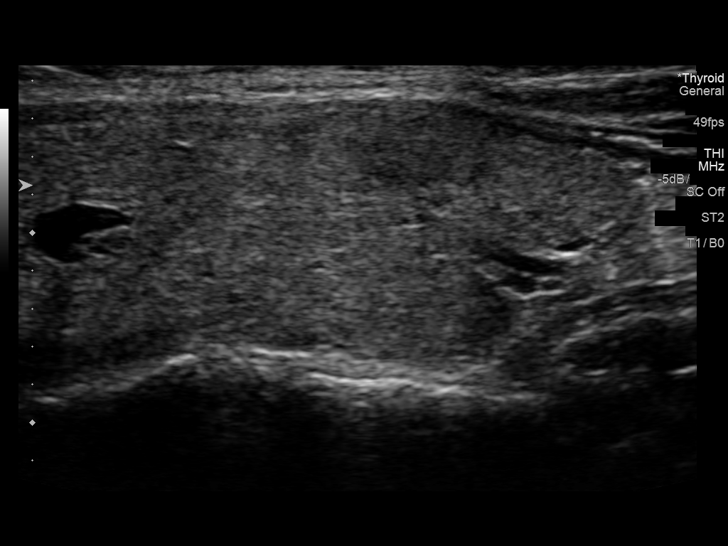
[im 31/41]
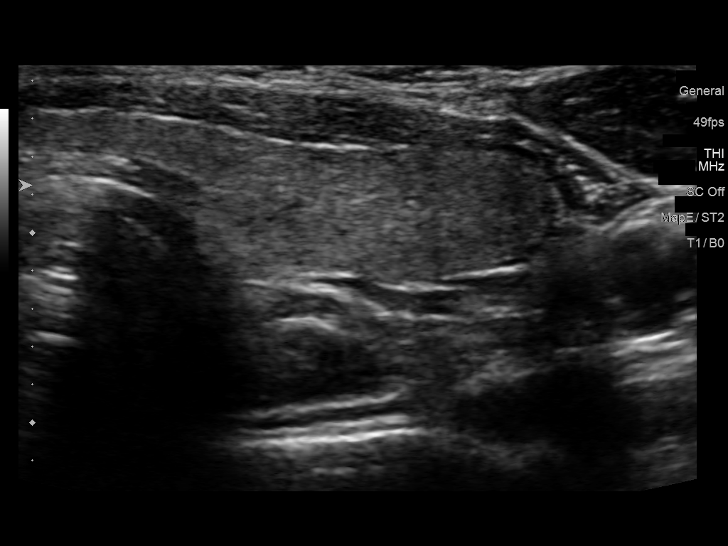
[im 34/41]
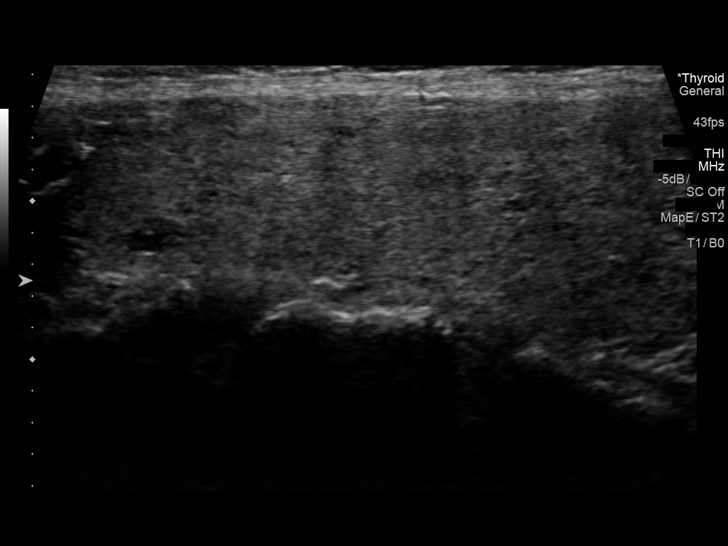
[im 37/41]
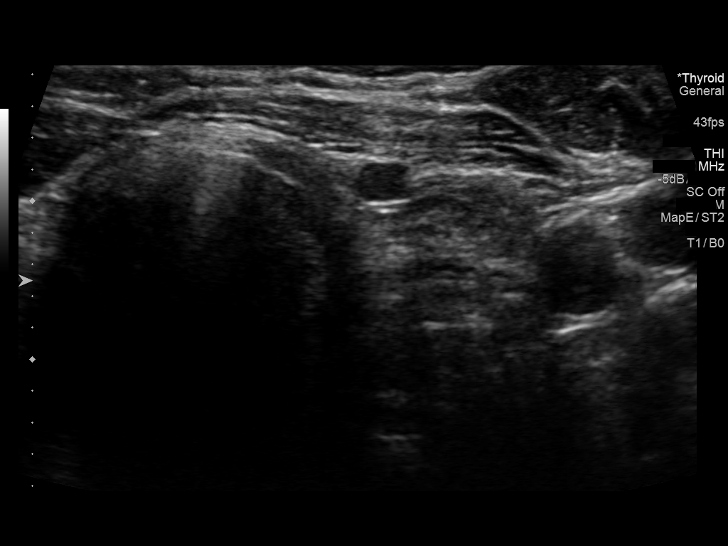
[im 41/41]
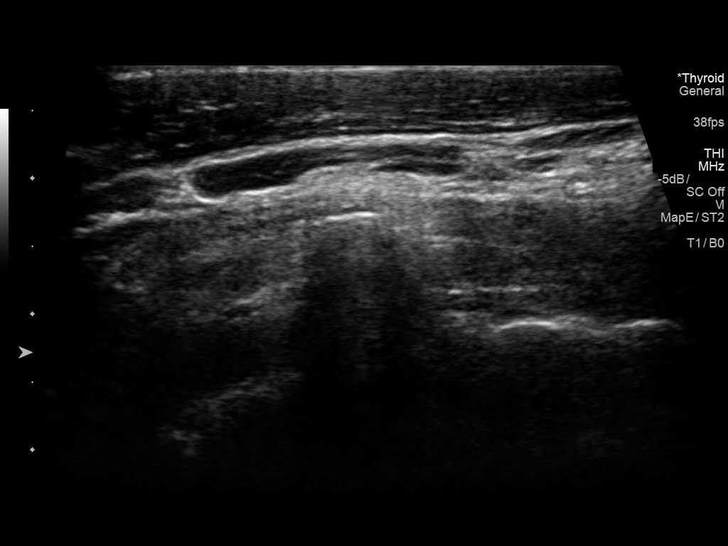

[13 of 25 positions shown; findings below may reference images not displayed]

FINDINGS: Parenchymal Echotexture: Moderately heterogenous

Isthmus: 0.2 cm

Right lobe: Hemithyroidectomy

Left lobe: 5.2 cm x 1.6 cm x 2.0 cm

_________________________________________________________

Estimated total number of nodules >/= 1 cm: 1

Number of spongiform nodules >/=  2 cm not described below (TR1): 0

Number of mixed cystic and solid nodules >/= 1.5 cm not described
below (TR2): 0

_________________________________________________________

Nodule # 1:

Location: Left; Mid

Maximum size: 0.6 cm; Other 2 dimensions: 0.6 cm x 0.4 cm

Composition: cystic/almost completely cystic (0)

Echogenicity: anechoic (0)

Shape: not taller-than-wide (0)

Margins: smooth (0)

Echogenic foci: none (0)

ACR TI-RADS total points: 0.

ACR TI-RADS risk category: TR1 (0-1 points).

ACR TI-RADS recommendations:

Cystic nodule does not meet criteria for surveillance or biopsy

_________________________________________________________

Nodule # 2:

Location: Left; Mid

Maximum size: 1.0 cm; Other 2 dimensions: 1.0 cm x 0.6 cm

Composition: solid/almost completely solid (2)

Echogenicity: hypoechoic (2)

Shape: not taller-than-wide (0)

Margins: ill-defined (0)

Echogenic foci: none (0)

ACR TI-RADS total points: 4.

ACR TI-RADS risk category: TR4 (4-6 points).

ACR TI-RADS recommendations:

Nodule meets criteria for continued surveillance

_________________________________________________________

No adenopathy
IMPRESSION: Left mid thyroid nodule (labeled 2) meets criteria for continued
surveillance, as designated by the newly established ACR TI-RADS
criteria. Surveillance ultrasound study recommended to be performed
annually up to 5 years. Five years would be 3233.

Recommendations follow those established by the new ACR TI-RADS
criteria ([HOSPITAL] 7210;[DATE]).

Right hemithyroidectomy.

## 2020-03-01 ENCOUNTER — Ambulatory Visit: Payer: 59 | Attending: Internal Medicine

## 2020-03-01 DIAGNOSIS — Z23 Encounter for immunization: Secondary | ICD-10-CM

## 2020-03-01 NOTE — Progress Notes (Signed)
   Covid-[facility] Vaccination [facility]  Name:  Donna Stephens    MRN: [id number] DOB: 1971-11-16  03/01/2020  Ms. Will was observed post Covid-19 immunization for 15 minutes without incident. She was provided with Vaccine Information Sheet and instruction to access the V-Safe system.   [name] was instructed to call 911 with any severe reactions post vaccine: Marland Kitchen Difficulty breathing  . Swelling of face and throat  . A fast heartbeat  . A bad rash all over body  . Dizziness and weakness   Immunizations Administered    Name Date Dose VIS Date Route   Pfizer COVID-19 Vaccine 03/01/2020 10:37 AM 0.3 mL 11/17/2019 Intramuscular   Manufacturer: [employer], [employer]   Lot: [id number]   NDC: [address]-7

## 2020-03-26 ENCOUNTER — Ambulatory Visit: Payer: 59 | Attending: Internal Medicine

## 2020-03-26 DIAGNOSIS — Z23 Encounter for immunization: Secondary | ICD-10-CM

## 2020-03-26 NOTE — Progress Notes (Signed)
   Covid-19 Vaccination Clinic  Name:  Donna Stephens    MRN: [id number] DOB: 18-Feb-1971  03/26/2020  Ms. Grunden was observed post Covid-19 immunization for 15 minutes without incident. She was provided with Vaccine Information Sheet and instruction to access the V-Safe system.   Ms. Starzyk was instructed to call 911 with any severe reactions post vaccine: Marland Kitchen Difficulty breathing  . Swelling of face and throat  . A fast heartbeat  . A bad rash all over body  . Dizziness and weakness   Immunizations Administered    Name Date Dose VIS Date Route   Pfizer COVID-19 Vaccine 03/26/2020 10:33 AM 0.3 mL 01/31/2019 Intramuscular   Manufacturer: [employer], [employer]   Lot: [id number]   NDC: [address]-0
